# Patient Record
Sex: Female | Born: 1970 | Hispanic: No | Marital: Married | Smoking: Never smoker
Health system: Southern US, Community
[De-identification: ages and names within clinical notes are randomized; demographics above are authoritative.]

## PROBLEM LIST (undated history)

## (undated) ENCOUNTER — Inpatient Hospital Stay (HOSPITAL_COMMUNITY): Payer: Self-pay

## (undated) DIAGNOSIS — R12 Heartburn: Secondary | ICD-10-CM

## (undated) DIAGNOSIS — T7840XA Allergy, unspecified, initial encounter: Secondary | ICD-10-CM

## (undated) DIAGNOSIS — O26899 Other specified pregnancy related conditions, unspecified trimester: Secondary | ICD-10-CM

## (undated) DIAGNOSIS — O24419 Gestational diabetes mellitus in pregnancy, unspecified control: Secondary | ICD-10-CM

## (undated) HISTORY — DX: Allergy, unspecified, initial encounter: T78.40XA

---

## 2007-08-17 ENCOUNTER — Ambulatory Visit (HOSPITAL_COMMUNITY): Admission: RE | Admit: 2007-08-17 | Discharge: 2007-08-17 | Payer: Self-pay | Admitting: Family Medicine

## 2007-11-09 ENCOUNTER — Encounter: Admission: RE | Admit: 2007-11-09 | Discharge: 2007-11-09 | Payer: Self-pay | Admitting: Obstetrics & Gynecology

## 2007-11-09 ENCOUNTER — Ambulatory Visit: Payer: Self-pay | Admitting: Obstetrics & Gynecology

## 2007-11-16 ENCOUNTER — Ambulatory Visit: Payer: Self-pay | Admitting: Obstetrics & Gynecology

## 2007-11-23 ENCOUNTER — Ambulatory Visit (HOSPITAL_COMMUNITY): Admission: RE | Admit: 2007-11-23 | Discharge: 2007-11-23 | Payer: Self-pay | Admitting: Obstetrics & Gynecology

## 2007-11-23 ENCOUNTER — Ambulatory Visit: Payer: Self-pay | Admitting: Obstetrics & Gynecology

## 2007-12-03 ENCOUNTER — Ambulatory Visit: Payer: Self-pay | Admitting: Obstetrics & Gynecology

## 2007-12-14 ENCOUNTER — Ambulatory Visit: Payer: Self-pay | Admitting: Obstetrics & Gynecology

## 2008-01-04 ENCOUNTER — Ambulatory Visit: Payer: Self-pay | Admitting: Obstetrics & Gynecology

## 2008-01-07 ENCOUNTER — Ambulatory Visit: Payer: Self-pay | Admitting: Gynecology

## 2008-01-07 ENCOUNTER — Inpatient Hospital Stay (HOSPITAL_COMMUNITY): Admission: AD | Admit: 2008-01-07 | Discharge: 2008-01-10 | Payer: Self-pay | Admitting: Gynecology

## 2008-03-28 ENCOUNTER — Encounter: Admission: RE | Admit: 2008-03-28 | Discharge: 2008-03-28 | Payer: Self-pay | Admitting: Obstetrics & Gynecology

## 2009-05-02 ENCOUNTER — Emergency Department (HOSPITAL_COMMUNITY): Admission: EM | Admit: 2009-05-02 | Discharge: 2009-05-02 | Payer: Self-pay | Admitting: Emergency Medicine

## 2010-10-21 ENCOUNTER — Encounter: Payer: Self-pay | Admitting: *Deleted

## 2011-02-12 NOTE — Op Note (Signed)
Patty Blair, Patty NO.:  [id number]   MEDICAL RECORD NO.:  [id number]          PATIENT TYPE:  INP   LOCATION:                                FACILITY:  [facility]   PHYSICIAN:  Ginger Carne, MD  DATE OF BIRTH:  1971-03-27   DATE OF PROCEDURE:  01/07/2008  DATE OF DISCHARGE:                               OPERATIVE REPORT   PREOPERATIVE DIAGNOSIS:  Category II fetal heart rate tracing,  nonreassuring fetal heart rate, fetal tachycardia.   POSTOPERATIVE DIAGNOSIS:  Category II fetal heart rate tracing,  nonreassuring fetal heart rate, fetal tachycardia, term viable delivery  of female infant.   PROCEDURE:  Primary low transverse cesarean section.   SURGEON:  Ginger Carne, M.D.   ASSISTANT:  None.   COMPLICATIONS:  None immediate.   ESTIMATED BLOOD LOSS:  600 mL.   ANESTHESIA:  Epidural top off.   SPECIMEN:  Cord bloods.   OPERATIVE FINDINGS:  Term infant female delivered in vertex  presentation.  Apgar and weight per delivery room record. No gross  abnormalities.  Baby cried spontaneously at delivery.  Amniotic fluid  was meconium stained, not foul smelling.  Uterus, tubes and ovaries  showed normal decidual changes of pregnancy. Placenta was complete,  maternal and fetal surfaces normal. 3 vessel cord with central  insertion.   OPERATIVE PROCEDURE:  The patient was prepped and draped in the usual  fashion and placed in the left lateral supine position.  Betadine  solution was used for antiseptic and the patient had been catheterized  prior to the procedure.  After adequate epidural analgesia, a  Pfannenstiel incision was made and the abdomen opened.  The lower  uterine segment was incised transversely after developing the bladder  flap.  The baby delivered, cord clamped and cut, and the infant given to  the pediatric staff after bulb suctioning.  The placenta was removed  manually.  The uterus was inspected.  Closure of the uterine musculature  in one layer of 0 Vicryl running interlocking suture.  Bleeding points  were hemostatically checked.  Blood clots were removed.  Closure of the  fascia in one layer with  double loop 0 PDS running suture and skin staples for the skin.  The  instrument, needle, and sponge counts were correct.  The patient  tolerated the procedure well and returned to the post anesthesia  recovery room in excellent condition.      Ginger Carne, MD  Electronically Signed     SHB/MEDQ  D:  01/07/2008  T:  01/07/2008  Job:  [id number]

## 2011-02-12 NOTE — Discharge Summary (Signed)
NAMEMARGARIE, Patty Blair                ACCOUNT NO.:  [id number]   MEDICAL RECORD NO.:  [id number]          PATIENT TYPE:  INP   LOCATION:                                FACILITY:  [facility]   PHYSICIAN:  Tanya S. Shawnie Pons, M.D.   DATE OF BIRTH:  1971-01-11   DATE OF ADMISSION:  01/07/2008  DATE OF DISCHARGE:  01/10/2008                               DISCHARGE SUMMARY   FINAL DIAGNOSES:  Primary low transverse C-section for nonreassuring  fetal heart tones, viable female.  No complications.   SUMMARY OF LAB DATA:  At admission, the patient had a CBC that showed a  white blood cell count of 8.0, hemoglobin 14.0, hematocrit 39.9, and  platelets 242.  RPR nonreactive.  Repeat CBC showed a white blood cell  count of 7.0, hemoglobin 11.1, hematocrit 31.5, and platelets 198.   BRIEF SUMMARY OF HOSPITAL COURSE:  The patient is a 40 year old G3, P3-0-  0-3 who was admitted for onset of labor and due to nonreassuring fetal  heart tones and thick meconium.  She had a primary low-transverse C-  section performed on January 07, 2008.  EBL of 600 mL.  No complications.  This patient is blood type A, Rh positive, GBS negative, GC and  Chlamydia negative.  She had an abnormal 3-hour GTT, but had refused  treatment with glyburide and had been treated only with diet.  Rubella  nonimmune.  RPR nonreactive.  HIV negative.  The patient and infant  tolerated the procedure well.  Infant is breastfeeding without  complications.  Plan for contraception, Depo-Provera 1 dose IM before  discharge and then plan for IUD placement at followup at 6 weeks.  The  patient will follow up at [facility] in 6  weeks.  She will have her staples removed the day of discharge.  She  will need a 2-hour glucola test at her 6 weeks' followup.  Infant's  Apgars were 9 and 9, weight 7 pounds 15 ounces, length 20.5 cm.  The  patient will receive MMR vaccine before discharge.     ______________________________  Obstetrics Resident      Shelbie Proctor. Shawnie Pons, M.D.  Electronically Signed    OR/MEDQ  D:  01/10/2008  T:  01/10/2008  Job:  [id number]

## 2011-06-21 LAB — POCT URINALYSIS DIP (DEVICE)
Glucose, UA: NEGATIVE
Glucose, UA: NEGATIVE
Ketones, ur: NEGATIVE
Ketones, ur: NEGATIVE
Nitrite: NEGATIVE
Nitrite: NEGATIVE
Specific Gravity, Urine: 1.025
Specific Gravity, Urine: 1.025
Urobilinogen, UA: 1
pH: 6
pH: 6

## 2011-06-24 LAB — POCT URINALYSIS DIP (DEVICE)
Glucose, UA: NEGATIVE
Hgb urine dipstick: NEGATIVE
Ketones, ur: NEGATIVE
Protein, ur: 30 — AB
Specific Gravity, Urine: 1.015
pH: 7

## 2011-06-25 LAB — CBC
Hemoglobin: 11.1 — ABNORMAL LOW
Hemoglobin: 14
MCHC: 35
MCHC: 35.4
MCV: 92.3
MCV: 92.9
Platelets: 198
Platelets: 242
RDW: 13.8

## 2011-06-25 LAB — POCT URINALYSIS DIP (DEVICE)
Glucose, UA: NEGATIVE
Nitrite: NEGATIVE
Operator id: 194561
Protein, ur: NEGATIVE
Specific Gravity, Urine: 1.01
Urobilinogen, UA: 0.2
pH: 6.5

## 2011-06-25 LAB — RPR: RPR Ser Ql: NONREACTIVE

## 2012-01-01 ENCOUNTER — Inpatient Hospital Stay (HOSPITAL_COMMUNITY)
Admission: AD | Admit: 2012-01-01 | Discharge: 2012-01-01 | Disposition: A | Discharge status: Home / Self Care | Payer: Medicaid Other | Source: Ambulatory Visit | Attending: Obstetrics & Gynecology | Admitting: Obstetrics & Gynecology

## 2012-01-01 ENCOUNTER — Inpatient Hospital Stay (HOSPITAL_COMMUNITY): Payer: Medicaid Other

## 2012-01-01 ENCOUNTER — Encounter (HOSPITAL_COMMUNITY): Payer: Self-pay | Admitting: *Deleted

## 2012-01-01 DIAGNOSIS — O209 Hemorrhage in early pregnancy, unspecified: Secondary | ICD-10-CM

## 2012-01-01 DIAGNOSIS — N390 Urinary tract infection, site not specified: Secondary | ICD-10-CM

## 2012-01-01 DIAGNOSIS — O2390 Unspecified genitourinary tract infection in pregnancy, unspecified trimester: Secondary | ICD-10-CM

## 2012-01-01 DIAGNOSIS — O2340 Unspecified infection of urinary tract in pregnancy, unspecified trimester: Secondary | ICD-10-CM

## 2012-01-01 LAB — URINALYSIS, ROUTINE W REFLEX MICROSCOPIC
Bilirubin Urine: NEGATIVE
Glucose, UA: NEGATIVE mg/dL
Ketones, ur: NEGATIVE mg/dL
Protein, ur: 100 mg/dL — AB
Specific Gravity, Urine: 1.025 (ref 1.005–1.030)
Urobilinogen, UA: 0.2 mg/dL (ref 0.0–1.0)
pH: 5.5 (ref 5.0–8.0)

## 2012-01-01 LAB — URINE MICROSCOPIC-ADD ON

## 2012-01-01 LAB — CBC
MCH: 29.7 pg (ref 26.0–34.0)
MCHC: 33.9 g/dL (ref 30.0–36.0)
RDW: 13.2 % (ref 11.5–15.5)

## 2012-01-01 LAB — WET PREP, GENITAL: Yeast Wet Prep HPF POC: NONE SEEN

## 2012-01-01 LAB — HCG, QUANTITATIVE, PREGNANCY: hCG, Beta Chain, Quant, S: 8151 m[IU]/mL — ABNORMAL HIGH (ref <5)

## 2012-01-01 LAB — ABO/RH: ABO/RH(D): A POS

## 2012-01-01 MED ORDER — CEPHALEXIN 500 MG PO CAPS: 500.0000 mg | ORAL_CAPSULE | Freq: Two times a day (BID) | ORAL | Status: AC

## 2012-01-01 NOTE — MAU Provider Note (Signed)
History     CSN: [id number]  Arrival date and time: 01/01/12 4098   None     Chief Complaint  Patient presents with  . Vaginal Bleeding   HPI 41 y.o. G5P3003 at [redacted] weeks EGA with vaginal bleeding starting tonight and some mild low abd pain starting since arrival to MAU. Pregnancy history significant for 1 c/s and GDM.    Past Medical History  Diagnosis Date  . No pertinent past medical history     Past Surgical History  Procedure Date  . Cesarean section     No family history on file.  History  Substance Use Topics  . Smoking status: Never Smoker   . Smokeless tobacco: Not on file  . Alcohol Use: No    Allergies: No Known Allergies  No prescriptions prior to admission    Review of Systems  Constitutional: Negative.   Respiratory: Negative.   Cardiovascular: Negative.   Gastrointestinal: Positive for abdominal pain. Negative for nausea, vomiting, diarrhea and constipation.  Genitourinary: Negative for dysuria, urgency, frequency, hematuria and flank pain.       Positive for vaginal bleeding  Musculoskeletal: Negative.   Neurological: Negative.   Psychiatric/Behavioral: Negative.    Physical Exam   Blood pressure 97/58, pulse 84, temperature 98.6 F (37 C), temperature source Oral, resp. rate 18, height 5\' 4"  (1.626 m), weight 166 lb (75.297 kg), last menstrual period 11/10/2011, SpO2 100.00%.  Physical Exam  Nursing note and vitals reviewed. Constitutional: She is oriented to person, place, and time. She appears well-developed and well-nourished. No distress.  HENT:  Head: Normocephalic and atraumatic.  Cardiovascular: Normal rate, regular rhythm and normal heart sounds.   Respiratory: Effort normal and breath sounds normal. No respiratory distress.  GI: Soft. Bowel sounds are normal. She exhibits no distension and no mass. There is no tenderness. There is no rebound and no guarding.  Genitourinary: There is no rash or lesion on the right labia. There is  no rash or lesion on the left labia. Uterus is not deviated, not enlarged, not fixed and not tender. Cervix exhibits no motion tenderness, no discharge and no friability. Right adnexum displays no mass, no tenderness and no fullness. Left adnexum displays no mass, no tenderness and no fullness. There is bleeding (small) around the vagina. No erythema or tenderness around the vagina. No vaginal discharge found.  Neurological: She is alert and oriented to person, place, and time.  Skin: Skin is warm and dry.  Psychiatric: She has a normal mood and affect.    MAU Course  Procedures  Results for orders placed during the hospital encounter of 01/01/12 (from the past 24 hour(s))  URINALYSIS, ROUTINE W REFLEX MICROSCOPIC     Status: Abnormal   Collection Time   01/01/12  9:20 PM      Component Value Range   Color, Urine YELLOW  YELLOW    APPearance HAZY (*) CLEAR    Specific Gravity, Urine 1.025  1.005 - 1.030    pH 5.5  5.0 - 8.0    Glucose, UA NEGATIVE  NEGATIVE (mg/dL)   Hgb urine dipstick LARGE (*) NEGATIVE    Bilirubin Urine NEGATIVE  NEGATIVE    Ketones, ur NEGATIVE  NEGATIVE (mg/dL)   Protein, ur 119 (*) NEGATIVE (mg/dL)   Urobilinogen, UA 0.2  0.0 - 1.0 (mg/dL)   Nitrite POSITIVE (*) NEGATIVE    Leukocytes, UA NEGATIVE  NEGATIVE   URINE MICROSCOPIC-ADD ON     Status: Abnormal   Collection  Time   01/01/12  9:20 PM      Component Value Range   Squamous Epithelial / LPF MANY (*) RARE    WBC, UA 3-6  <3 (WBC/hpf)   RBC / HPF TOO NUMEROUS TO COUNT  <3 (RBC/hpf)   Bacteria, UA MANY (*) RARE    Urine-Other MUCOUS PRESENT    POCT PREGNANCY, URINE     Status: Abnormal   Collection Time   01/01/12  9:25 PM      Component Value Range   Preg Test, Ur POSITIVE (*) NEGATIVE   WET PREP, GENITAL     Status: Abnormal   Collection Time   01/01/12  9:45 PM      Component Value Range   Yeast Wet Prep HPF POC NONE SEEN  NONE SEEN    Trich, Wet Prep NONE SEEN  NONE SEEN    Clue Cells Wet Prep HPF  POC NONE SEEN  NONE SEEN    WBC, Wet Prep HPF POC FEW (*) NONE SEEN   CBC     Status: Normal   Collection Time   01/01/12  9:45 PM      Component Value Range   WBC 6.7  4.0 - 10.5 (K/uL)   RBC 4.18  3.87 - 5.11 (MIL/uL)   Hemoglobin 12.4  12.0 - 15.0 (g/dL)   HCT 16.1  09.6 - 04.5 (%)   MCV 87.6  78.0 - 100.0 (fL)   MCH 29.7  26.0 - 34.0 (pg)   MCHC 33.9  30.0 - 36.0 (g/dL)   RDW 40.9  81.1 - 91.4 (%)   Platelets 363  150 - 400 (K/uL)  ABO/RH     Status: Normal   Collection Time   01/01/12  9:45 PM      Component Value Range   ABO/RH(D) A POS    HCG, QUANTITATIVE, PREGNANCY     Status: Abnormal   Collection Time   01/01/12  9:45 PM      Component Value Range   hCG, Beta Chain, Quant, S 8151 (*) <5 (mIU/mL)   US Ob Comp Less 14 Wks  01/01/2012  *RADIOLOGY REPORT*  Clinical Data: Bleeding.  Beta HCG levels are pending.  Estimated gestational age by LMP is 7 weeks.  OBSTETRIC <14 WK [place] AND TRANSVAGINAL OB US  Technique:  Both transabdominal and transvaginal ultrasound examinations were performed for complete evaluation of the gestation as well as the maternal uterus, adnexal regions, and pelvic cul-de-sac.  Transvaginal technique was performed to assess early pregnancy.  Comparison:  None.  Intrauterine gestational sac:  A single intrauterine gestational sac is visualized.  No subchorionic hemorrhage. Yolk sac: Present Embryo: Present Cardiac Activity: Visualized Heart Rate: 122 bpm  CRL: 9.6   mm  7   w  1   d         [place] EDC: 08/18/2012  Maternal uterus/adnexae: No focal myometrial mass lesions.  No free pelvic fluid collections.  The right ovary demonstrates a simple para ovarian cyst which measures about 1.1 x 0.8 x 0.8 cm.  The left ovary demonstrates a simple cyst measuring 3.2 x 3 x 3 cm, likely representing corpus luteum cyst.  Flow is demonstrated within the left ovary but not within the cyst on color flow Doppler images.  IMPRESSION: Single intrauterine pregnancy.  Estimated gestational age  by crown- rump length is 7 weeks 1 day.  No complications visualized.  Original Report Authenticated By: Marlon Pel, M.D.   US Ob Transvaginal  01/01/2012  *RADIOLOGY REPORT*  Clinical Data: Bleeding.  Beta HCG levels are pending.  Estimated gestational age by LMP is 7 weeks.  OBSTETRIC <14 WK [place] AND TRANSVAGINAL OB US  Technique:  Both transabdominal and transvaginal ultrasound examinations were performed for complete evaluation of the gestation as well as the maternal uterus, adnexal regions, and pelvic cul-de-sac.  Transvaginal technique was performed to assess early pregnancy.  Comparison:  None.  Intrauterine gestational sac:  A single intrauterine gestational sac is visualized.  No subchorionic hemorrhage. Yolk sac: Present Embryo: Present Cardiac Activity: Visualized Heart Rate: 122 bpm  CRL: 9.6   mm  7   w  1   d         [place] EDC: 08/18/2012  Maternal uterus/adnexae: No focal myometrial mass lesions.  No free pelvic fluid collections.  The right ovary demonstrates a simple para ovarian cyst which measures about 1.1 x 0.8 x 0.8 cm.  The left ovary demonstrates a simple cyst measuring 3.2 x 3 x 3 cm, likely representing corpus luteum cyst.  Flow is demonstrated within the left ovary but not within the cyst on color flow Doppler images.  IMPRESSION: Single intrauterine pregnancy.  Estimated gestational age by crown- rump length is 7 weeks 1 day.  No complications visualized.  Original Report Authenticated By: Marlon Pel, M.D.    Assessment and Plan  41 y.o. G5P3003 at 7.[redacted] weeks EGA Vaginal bleeding - precautions rev'd UTI - rx keflex, urine culture sent Start prenatal care as soon as possible  Kyriaki Moder 01/02/2012, 2:01 AM

## 2012-01-02 LAB — GC/CHLAMYDIA PROBE AMP, URINE
Chlamydia, Swab/Urine, PCR: NEGATIVE
GC Probe Amp, Urine: NEGATIVE

## 2012-01-03 LAB — URINE CULTURE: Colony Count: >=100000

## 2012-01-04 ENCOUNTER — Inpatient Hospital Stay (HOSPITAL_COMMUNITY)
Admission: AD | Admit: 2012-01-04 | Discharge: 2012-01-05 | Disposition: A | Discharge status: Home / Self Care | Payer: Medicaid Other | Source: Ambulatory Visit | Attending: Obstetrics & Gynecology | Admitting: Obstetrics & Gynecology

## 2012-01-04 ENCOUNTER — Encounter (HOSPITAL_COMMUNITY): Payer: Self-pay | Admitting: *Deleted

## 2012-01-04 ENCOUNTER — Inpatient Hospital Stay (HOSPITAL_COMMUNITY): Payer: Medicaid Other

## 2012-01-04 DIAGNOSIS — R109 Unspecified abdominal pain: Secondary | ICD-10-CM | POA: Insufficient documentation

## 2012-01-04 DIAGNOSIS — O039 Complete or unspecified spontaneous abortion without complication: Secondary | ICD-10-CM

## 2012-01-04 NOTE — MAU Provider Note (Signed)
Chief Complaint:  Abdominal Pain and Vaginal Bleeding    First Provider Initiated Contact with Patient 01/04/12 2051      Patty Blair is  41 y.o. [phone].  Patient's last menstrual period was 11/10/2011.Marland Kitchen [redacted]w[redacted]d by early [place] on 01/01/12.  She presents complaining of Abdominal Pain and Vaginal Bleeding  Reports bleeding has been ongoing since her last MAU visit on 01/01/12 and the cramping started yesterday and has been increasing. Reports changing pad q 4 hours.  Obstetrical/Gynecological History: OB History    Grav Para Term Preterm Abortions TAB SAB Ect Mult Living   4 3 3  0 0 0 0 0 0 3      Past Medical History: Past Medical History  Diagnosis Date  . No pertinent past medical history     Past Surgical History: Past Surgical History  Procedure Date  . Cesarean section     Family History: Family History  Problem Relation Age of Onset  . Anesthesia problems Neg Hx   . Hypotension Neg Hx   . Malignant hyperthermia Neg Hx   . Pseudochol deficiency Neg Hx     Social History: History  Substance Use Topics  . Smoking status: Never Smoker   . Smokeless tobacco: Not on file  . Alcohol Use: No    Allergies: No Known Allergies  Prescriptions prior to admission  Medication Sig Dispense Refill  . cephALEXin (KEFLEX) 500 MG capsule Take 1 capsule (500 mg total) by mouth 2 (two) times daily.  14 capsule  0  . Prenatal Vit-Fe Fumarate-FA (PRENATAL MULTIVITAMIN) TABS Take 1 tablet by mouth daily.        Review of Systems - Negative except what has been reviewed in the HPI  Physical Exam   Blood pressure 121/75, pulse 84, temperature 98.2 F (36.8 C), temperature source Oral, resp. rate 18, height 5' 4.75" (1.645 m), weight 165 lb 4 oz (74.957 kg), last menstrual period 11/10/2011.  General: General appearance - alert, well appearing, and in no distress, oriented to person, place, and time and overweight Mental status - alert, oriented to person, place, and time, normal  mood, behavior, speech, dress, motor activity, and thought processes, affect appropriate to mood Abdomen - soft, nontender, nondistended, no masses or organomegaly Low transverse scar noted without tenderness Focused Gynecological Exam: VULVA: normal appearing vulva with no masses, tenderness or lesions, bleeding noted on perineum, VAGINA: vaginal discharge - bloody and moderate amount, CERVIX: cervical discharge present - bloody and dark bleeding coming from os  Labs: No results found for this or any previous visit (from the past 24 hour(s)). Imaging Studies:   RADIOLOGY REPORT*  Clinical Data: Bleeding. Early pregnancy.  TRANSVAGINAL OB ULTRASOUND  Technique: Transvaginal ultrasound was performed for evaluation of  the gestation as well as the maternal uterus and adnexal regions.  Comparison: 01/01/2012  Findings: Retroverted uterus noted. The previously seen  gestational sac is no longer observed, compatible with spontaneous  abortion. The right ovary measures 2.7 x 2.3 x 1.6 cm. The left  ovary contains a 3 cm simple cyst. Two small cysts are present  along the margin of the left ovary is well.  Endometrial thickness is 10 mm.  IMPRESSION:  1. The endometrial gestational sac is no longer present,  compatible with spontaneous abortion.  2. Left ovarian cyst noted.  Original Report Authenticated By: Dellia Cloud, M.D.       US Ob Comp Less 14 Wks  01/01/2012  *RADIOLOGY REPORT*  Clinical Data: Bleeding.  Beta HCG levels are pending.  Estimated gestational age by LMP is 7 weeks.  OBSTETRIC <14 WK [place] AND TRANSVAGINAL OB US  Technique:  Both transabdominal and transvaginal ultrasound examinations were performed for complete evaluation of the gestation as well as the maternal uterus, adnexal regions, and pelvic cul-de-sac.  Transvaginal technique was performed to assess early pregnancy.  Comparison:  None.  Intrauterine gestational sac:  A single intrauterine gestational sac is  visualized.  No subchorionic hemorrhage. Yolk sac: Present Embryo: Present Cardiac Activity: Visualized Heart Rate: 122 bpm  CRL: 9.6   mm  7   w  1   d         [place] EDC: 08/18/2012  Maternal uterus/adnexae: No focal myometrial mass lesions.  No free pelvic fluid collections.  The right ovary demonstrates a simple para ovarian cyst which measures about 1.1 x 0.8 x 0.8 cm.  The left ovary demonstrates a simple cyst measuring 3.2 x 3 x 3 cm, likely representing corpus luteum cyst.  Flow is demonstrated within the left ovary but not within the cyst on color flow Doppler images.  IMPRESSION: Single intrauterine pregnancy.  Estimated gestational age by crown- rump length is 7 weeks 1 day.  No complications visualized.  Original Report Authenticated By: Marlon Pel, M.D.     Assessment: Complete AB  Plan: Discharge home FU in Gyn Clinic in 4-6 weeks, Clinic staff will call with the date and time of your appt.  Temeka Pore E. 14-Jan-2012,1:17 AM

## 2012-01-04 NOTE — MAU Note (Signed)
Pt up to BR

## 2012-01-04 NOTE — MAU Note (Signed)
[facility] CNM at bedside with MAU small u/s.

## 2012-01-04 NOTE — MAU Note (Signed)
Pt states, " I was here Wednesday for bleeding, but today about one hour ago I started having cramping in my right low abdomen. I am still bleeding and use a pad every four hours."

## 2012-01-04 NOTE — Progress Notes (Signed)
Spec exam done by S[employer]

## 2012-01-05 MED ORDER — IBUPROFEN 600 MG PO TABS
600.0000 mg | ORAL_TABLET | Freq: Once | ORAL | Status: AC
  Administered 2012-01-05: 600 mg via ORAL
  Filled 2012-01-05: qty 1

## 2012-01-05 MED ORDER — ACETAMINOPHEN 325 MG PO TABS: 650.0000 mg | ORAL_TABLET | Freq: Four times a day (QID) | ORAL | Status: DC | PRN

## 2012-01-05 NOTE — Discharge Instructions (Signed)
Miscarriage (Spontaneous Miscarriage)  A miscarriage is when you lose your baby before the twentieth week of pregnancy. Miscarriages happen in 15-20% of pregnancies. Most miscarriages happen in the first 13 weeks of the pregnancy. In medical terms, this is called a spontaneous miscarriage or early pregnancy loss. No further treatment is needed when the miscarriage is complete and all products of conception have been passed out of the body. You can begin trying for another pregnancy as soon as your caregiver says it is okay.  CAUSES    Most causes are not known.   Genetic problems like abnormal, not enough or too many chromosomes.   Infection of the cervix or uterus.   An abnormal shaped uterus, fibroid tumors or congenital abnormalities.   Hormone problems.   Medical problems.   Incompetent cervix, the tissue in the cervix is not strong enough to hold the pregnancy.   Smoking, too much alcohol use and illegal drugs.   Trauma.  SYMPTOMS    Bleeding or spotting from the vagina.   Cramping of the lower abdomen.   Passing of fluid from the vagina with or without cramps or pain.   Passing fetal tissue.  TREATMENT    Sometimes no further treatment is necessary if you pass all the tissue in the uterus.   If partial parts of the fetus or placenta remain in the body (incomplete miscarriage), tissue left behind may become infected. Usually a D and C (Dilatation and Curettage) suction or scrapping of the uterus is necessary to remove the remaining tissue in uterus. The procedure is only done when your caregiver knows that there is no chance for the pregnancy to continue. This is determined by a physical exam, a negative pregnancy test, blood tests and perhaps an ultrasound revealing a dead fetus or no fetus developing because a problem occurred at conception (when the sperm and egg unite).   Medications may be necessary, antibiotics if there is an infection or medications to contract the uterus if there is a  lot of bleeding.   If you have Rh negative blood and your partner is Rh positive, you will need a Rho-gam shot (an immune globulin vaccine). This will protect your baby from having Rh blood problems in future pregnancies.  HOME CARE INSTRUCTIONS    Your caregiver may order bed rest (up to the bathroom only). He or she may allow you to continue light activity. You may need to make arrangements for the care of children and for any other responsibilities.   Keep track of the number of pads you use each day and how soaked (saturated) they are. Record this information.   Do not use tampons. Do not douche or have sexual intercourse until approved by your caregiver.   Only take over-the-counter or prescription medicines for pain, discomfort or fever as directed by your caregiver.   Do not take aspirin because it can cause bleeding.   It is very important to keep all follow-up appointments for re-evaluations and continuing management.   Tell your caregiver if you are experiencing domestic violence.   Women who have an Rh negative blood type (i.e., A, B, AB, or O negative) need to receive a drug called Rh(D) immune globulin (RhoGam). This medicine helps protect future fetuses against problems that can occur if an Rh negative mother is carrying a baby who is Rh positive.   If you and/or your partner are having problems with guilt or grieving, talk to your caregiver or seek counseling to help   you cope with the pregnancy loss. Allow enough time to grieve before trying to get pregnant again.  SEEK IMMEDIATE MEDICAL CARE IF:    You have severe cramps or pain in your stomach, back, or belly (abdomen).   You have a fever.   You pass large clots or tissue. Save any tissue for your caregiver to inspect.   Your bleeding increases.   You become light-headed, weak or have fainting episodes.   You develop chills.  Document Released: 03/12/2001 Document Revised: 09/05/2011 Document Reviewed: 04/18/2008  [employer] Patient  Information 2012 [employer].

## 2012-01-05 NOTE — Progress Notes (Signed)
Pillow and miscarriage packet given. Message left for chaplain.

## 2012-01-05 NOTE — MAU Provider Note (Signed)
Attestation of Attending Supervision of Advanced Practitioner: Evaluation and management procedures were performed by the OB Fellow/PA/CNM/NP under my supervision and collaboration. Chart reviewed, and agree with management and plan.  Shakedra Beam, M.D. 01/15/2012 8:25 AM   

## 2012-01-29 DEATH — deceased

## 2012-02-07 ENCOUNTER — Ambulatory Visit (INDEPENDENT_AMBULATORY_CARE_PROVIDER_SITE_OTHER): Payer: Medicaid Other | Admitting: Family Medicine

## 2012-02-07 ENCOUNTER — Encounter: Payer: Self-pay | Admitting: Family Medicine

## 2012-02-07 DIAGNOSIS — O039 Complete or unspecified spontaneous abortion without complication: Secondary | ICD-10-CM

## 2012-02-07 NOTE — Progress Notes (Signed)
  Subjective:    Patient ID: Patty Blair, female    DOB: Oct 05, 1970, 41 y.o.   MRN: [id number]  HPI Patient seen for follow up from MAU for complete AB.  Had loss approximately 1 month ago.  Has had normal period last week.  Normal flow and length.  Denies cramping, abdominal pain, vaginal discharge.   Review of Systems     Objective:   Physical Exam  Constitutional: She is oriented to person, place, and time. She appears well-developed and well-nourished.  Neurological: She is alert and oriented to person, place, and time.  Skin: Skin is warm and dry.  Psychiatric: She has a normal mood and affect. Her behavior is normal. Judgment and thought content normal.       Assessment & Plan:  1.  Complete AB As she has resumed normal periods, no need for follow up.  Discussed should continue with prenatal vitamins.  Patient does desire to become pregnant.  Encouraged pt to wait another month before trying again.

## 2012-02-07 NOTE — Patient Instructions (Signed)
Miscarriage  An early pregnancy loss or spontaneous abortion (miscarriage) is a common problem. This usually happens when the pregnancy is not developing normally. It is very unlikely that you or your partner did anything to cause this, although cigarette smoking, a sexually transmitted disease, excessive alcohol use, or drug abuse can increase the risk. Other causes are:   Abnormalities of the uterus.   Hormone or medical problems.   Trauma or genetic (chromosome) problems.  Having a miscarriage does not change your chances of having a normal pregnancy in the future. Your caregiver will advise you when it is safe to try to get pregnant again.  AFTER A MISCARRIAGE   A miscarriage is inevitable when there is continual, heavy vaginal bleeding; cramping; dilation of the cervix; or passing of any pregnancy tissue. Bleeding and cramping will usually continue until all the tissue has been removed from the womb (uterus).   Often the uterus does not clean itself out completely. A medication or a D&C procedure is needed to loosen or remove the pregnancy tissue from the uterus. A D&C scrapes or suctions the tissue out.   If you are RH negative, you may need to have Rh immune globulin to avoid Rh problems.   You may be given medication to fight an infection if the miscarriage was due to an infection.  HOME CARE INSTRUCTIONS    You should rest in bed for the next 2 to 3 days.   Do not take tub baths or put anything in your vagina, including tampons or a douche.   Do not have sex until your caregiver approves.   Avoid exercise or heavy activities until directed by your caregiver.   Save any vaginal discharge that looks like tissue. Ask your caregiver if he or she wants to inspect the discharge.   If you and your partner are having problems with guilt or grieving, talk to your caregiver or get counseling to help you understand and cope with your pregnancy loss.   Allow enough time to grieve before trying to get  pregnant again.  SEEK IMMEDIATE MEDICAL CARE IF:    You have persistent heavy bleeding or a bad smelling vaginal discharge.   You have continued abdominal or pelvic pain.   You have an oral temperature above 102 F (38.9 C), not controlled by medicine.   You have severe weakness, fainting, or keep throwing up (vomiting).   You develop chills.   You are experiencing domestic violence.  MAKE SURE YOU:    Understand these instructions.   Will watch your condition.   Will get help right away if you are not doing well or get worse.  Document Released: 10/24/2004 Document Revised: 09/05/2011 Document Reviewed: 09/08/2008  Exit[employer] Patient Information 2012 [employer].

## 2012-04-22 LAB — OB RESULTS CONSOLE RUBELLA ANTIBODY, IGM: Rubella: IMMUNE

## 2012-04-22 LAB — OB RESULTS CONSOLE ABO/RH: RH Type: POSITIVE

## 2012-04-22 LAB — OB RESULTS CONSOLE RPR: RPR: NONREACTIVE

## 2012-04-22 LAB — OB RESULTS CONSOLE HIV ANTIBODY (ROUTINE TESTING): HIV: NONREACTIVE

## 2012-10-07 ENCOUNTER — Inpatient Hospital Stay (HOSPITAL_COMMUNITY)
Admission: AD | Admit: 2012-10-07 | Discharge: 2012-10-07 | Disposition: A | Discharge status: Home / Self Care | Payer: Medicaid Other | Source: Ambulatory Visit | Attending: Obstetrics & Gynecology | Admitting: Obstetrics & Gynecology

## 2012-10-07 ENCOUNTER — Encounter (HOSPITAL_COMMUNITY): Payer: Self-pay

## 2012-10-07 DIAGNOSIS — O26899 Other specified pregnancy related conditions, unspecified trimester: Secondary | ICD-10-CM

## 2012-10-07 DIAGNOSIS — O99891 Other specified diseases and conditions complicating pregnancy: Secondary | ICD-10-CM | POA: Insufficient documentation

## 2012-10-07 DIAGNOSIS — O9989 Other specified diseases and conditions complicating pregnancy, childbirth and the puerperium: Secondary | ICD-10-CM

## 2012-10-07 DIAGNOSIS — R0602 Shortness of breath: Secondary | ICD-10-CM | POA: Insufficient documentation

## 2012-10-07 HISTORY — DX: Gestational diabetes mellitus in pregnancy, unspecified control: O24.419

## 2012-10-07 LAB — URINALYSIS, ROUTINE W REFLEX MICROSCOPIC
Glucose, UA: NEGATIVE mg/dL
Leukocytes, UA: NEGATIVE
Protein, ur: NEGATIVE mg/dL
Specific Gravity, Urine: 1.01 (ref 1.005–1.030)
Urobilinogen, UA: 0.2 mg/dL (ref 0.0–1.0)

## 2012-10-07 LAB — GLUCOSE, CAPILLARY: Glucose-Capillary: 126 mg/dL — ABNORMAL HIGH (ref 70–99)

## 2012-10-07 NOTE — MAU Note (Signed)
Pt states that she's had trouble breathing since this morning. States she does not have a hx of asthma or respiratory issues

## 2012-10-07 NOTE — MAU Provider Note (Signed)
History     CSN: [id number]  Arrival date and time: 10/07/12 1853   None     Chief Complaint  Patient presents with  . Shortness of Breath   HPI Patty Blair is a 42 y.o. female @ [redacted]w[redacted]d gestation who presents to MAU with shortness of breath. The symptoms started earlier today. Patient denies pain, just feels like she can't take a deep breath. Nothing makes the symptoms better or worse. Denies any other pregnancy problems. The history was provided by the patient.  OB History    Grav Para Term Preterm Abortions TAB SAB Ect Mult Living   5 3 3  0 0 0 0 0 0 3      Past Medical History  Diagnosis Date  . No pertinent past medical history   . Gestational diabetes     Past Surgical History  Procedure Date  . Cesarean section     Family History  Problem Relation Age of Onset  . Anesthesia problems Neg Hx   . Hypotension Neg Hx   . Malignant hyperthermia Neg Hx   . Pseudochol deficiency Neg Hx     History  Substance Use Topics  . Smoking status: Never Smoker   . Smokeless tobacco: Not on file  . Alcohol Use: No    Allergies:  Allergies  Allergen Reactions  . Omnicef (Cefdinir) Swelling    Prescriptions prior to admission  Medication Sig Dispense Refill  . cholecalciferol (VITAMIN D) 1000 UNITS tablet Take 1,000 Units by mouth daily.      Marland Kitchen glyBURIDE (DIABETA) 2.5 MG tablet Take 2.5 mg by mouth daily with breakfast.      . Prenatal Vit-Fe Fumarate-FA (PRENATAL MULTIVITAMIN) TABS Take 1 tablet by mouth daily.        Review of Systems  Constitutional: Positive for malaise/fatigue. Negative for fever, chills and weight loss.  HENT: Negative for ear pain, nosebleeds, congestion, sore throat and neck pain.   Eyes: Negative for blurred vision, double vision, photophobia and pain.  Respiratory: Positive for shortness of breath. Negative for cough and wheezing.   Cardiovascular: Negative for chest pain, palpitations and leg swelling.  Gastrointestinal: Positive for  nausea and constipation. Negative for heartburn, vomiting, abdominal pain and diarrhea.  Genitourinary: Negative for dysuria, urgency and frequency.  Musculoskeletal: Positive for back pain. Negative for myalgias.  Skin: Negative for itching and rash.  Neurological: Negative for dizziness, sensory change, speech change, seizures, weakness and headaches.  Endo/Heme/Allergies: Does not bruise/bleed easily.  Psychiatric/Behavioral: Negative for depression. The patient is not nervous/anxious and does not have insomnia.    Physical Exam   Blood pressure 115/64, pulse 91, temperature 98 F (36.7 C), temperature source Oral, resp. rate 20, height 5\' 6"  (1.676 m), weight 81.647 kg (180 lb), last menstrual period 02/28/2012, SpO2 100.00%.  Physical Exam  Nursing note and vitals reviewed. Constitutional: She is oriented to person, place, and time. She appears well-developed and well-nourished. No distress.  HENT:  Head: Normocephalic and atraumatic.  Eyes: EOM are normal.  Neck: Neck supple.  Cardiovascular: Normal rate, regular rhythm and normal heart sounds.   Respiratory: Effort normal and breath sounds normal. No respiratory distress. She has no wheezes. She has no rales. She exhibits no tenderness.  GI: Soft. There is no tenderness.       Gravid, consistent with dates.  Musculoskeletal: Normal range of motion. She exhibits no edema.  Neurological: She is alert and oriented to person, place, and time.  Skin: Skin is warm  and dry.  Psychiatric: She has a normal mood and affect. Her behavior is normal. Judgment and thought content normal.   Procedures EFM: Baseline 145, reactive tracing, rare contraction  Assessment: 42 y.o.  Female @ [redacted]w[redacted]d with shortness of breath probably due to pregnancy     Plan:  Discussed with Dr. Tamela Oddi. Since lungs are clear and O2 Sat is 100% on room air will not order chest x-ray or ABG tonight.  She will evaluate the patient in the office in the  morning. I have reviewed this patient's vital signs, nurses notes, appropriate labs and discussed findings and plan of care with the patient in detail. She voices understanding.   Mersadie Kavanaugh, RN, FNP, [facility] 10/07/2012, 8:30 PM

## 2012-11-06 ENCOUNTER — Other Ambulatory Visit: Payer: Self-pay | Admitting: Obstetrics & Gynecology

## 2012-11-06 DIAGNOSIS — O24419 Gestational diabetes mellitus in pregnancy, unspecified control: Secondary | ICD-10-CM

## 2012-11-07 ENCOUNTER — Other Ambulatory Visit: Payer: Self-pay | Admitting: Obstetrics & Gynecology

## 2012-11-07 ENCOUNTER — Other Ambulatory Visit (HOSPITAL_COMMUNITY): Payer: Self-pay | Admitting: Obstetrics & Gynecology

## 2012-11-07 DIAGNOSIS — O24419 Gestational diabetes mellitus in pregnancy, unspecified control: Secondary | ICD-10-CM

## 2012-11-09 ENCOUNTER — Ambulatory Visit (HOSPITAL_COMMUNITY)
Admission: RE | Admit: 2012-11-09 | Discharge: 2012-11-09 | Disposition: A | Discharge status: Home / Self Care | Payer: Medicaid Other | Source: Ambulatory Visit | Attending: Obstetrics & Gynecology | Admitting: Obstetrics & Gynecology

## 2012-11-09 DIAGNOSIS — O24419 Gestational diabetes mellitus in pregnancy, unspecified control: Secondary | ICD-10-CM

## 2012-11-09 DIAGNOSIS — Z3689 Encounter for other specified antenatal screening: Secondary | ICD-10-CM | POA: Insufficient documentation

## 2012-11-09 DIAGNOSIS — O09299 Supervision of pregnancy with other poor reproductive or obstetric history, unspecified trimester: Secondary | ICD-10-CM | POA: Insufficient documentation

## 2012-11-09 DIAGNOSIS — O99810 Abnormal glucose complicating pregnancy: Secondary | ICD-10-CM | POA: Insufficient documentation

## 2012-11-09 DIAGNOSIS — O34219 Maternal care for unspecified type scar from previous cesarean delivery: Secondary | ICD-10-CM | POA: Insufficient documentation

## 2012-11-09 DIAGNOSIS — O09529 Supervision of elderly multigravida, unspecified trimester: Secondary | ICD-10-CM | POA: Insufficient documentation

## 2012-11-23 ENCOUNTER — Encounter (HOSPITAL_COMMUNITY): Payer: Self-pay | Admitting: Pharmacist

## 2012-11-27 ENCOUNTER — Encounter (HOSPITAL_COMMUNITY)
Admission: RE | Admit: 2012-11-27 | Discharge: 2012-11-27 | Disposition: A | Discharge status: Home / Self Care | Payer: Medicaid Other | Source: Ambulatory Visit | Attending: Obstetrics & Gynecology | Admitting: Obstetrics & Gynecology

## 2012-11-27 ENCOUNTER — Encounter (HOSPITAL_COMMUNITY): Payer: Self-pay

## 2012-11-27 HISTORY — DX: Heartburn: R12

## 2012-11-27 HISTORY — DX: Other specified pregnancy related conditions, unspecified trimester: O26.899

## 2012-11-27 LAB — BASIC METABOLIC PANEL
BUN: 8 mg/dL (ref 6–23)
CO2: 23 mEq/L (ref 19–32)
Chloride: 101 mEq/L (ref 96–112)
Creatinine, Ser: 0.39 mg/dL — ABNORMAL LOW (ref 0.50–1.10)
GFR calc Af Amer: >90 mL/min (ref >90)
Glucose, Bld: 96 mg/dL (ref 70–99)
Potassium: 3.7 mEq/L (ref 3.5–5.1)

## 2012-11-27 LAB — CBC
HCT: 39.1 % (ref 36.0–46.0)
Hemoglobin: 13.5 g/dL (ref 12.0–15.0)
MCV: 89.7 fL (ref 78.0–100.0)
RBC: 4.36 MIL/uL (ref 3.87–5.11)
RDW: 13.3 % (ref 11.5–15.5)
WBC: 6.3 10*3/uL (ref 4.0–10.5)

## 2012-11-27 NOTE — Patient Instructions (Addendum)
Your procedure is scheduled on:11/30/12  Enter through the Main Entrance at :0730 am Pick up desk phone and dial [phone] and inform us of your arrival.  Please call [phone] if you have any problems the morning of surgery.  Remember: Do not eat after midnight:SUNDAY Water ok until 5am on Monday  DO NOT wear jewelry, eye make-up, lipstick,body lotion, or dark fingernail polish. Do not shave for 48 hours prior to surgery.  If you are to be admitted after surgery, leave suitcase in car until your room has been assigned.

## 2012-11-28 DIAGNOSIS — Z98891 History of uterine scar from previous surgery: Secondary | ICD-10-CM

## 2012-11-28 DIAGNOSIS — O24919 Unspecified diabetes mellitus in pregnancy, unspecified trimester: Secondary | ICD-10-CM | POA: Diagnosis present

## 2012-11-28 NOTE — H&P (Signed)
Patty Blair is a 42 y.o. female presenting for a scheduled, repeat C/D. Maternal Medical History:  Reason for admission: For a scheduled, repeat C/D.  Prenatal Complications - Diabetes: gestational. Diabetes is managed by oral agent (monotherapy).      OB History   Grav Para Term Preterm Abortions TAB SAB Ect Mult Living   5 3 3  0 0 0 0 0 0 3     Past Medical History  Diagnosis Date  . No pertinent past medical history   . Gestational diabetes   . Heartburn in pregnancy    Past Surgical History  Procedure Laterality Date  . Cesarean section     Family History: family history is negative for Anesthesia problems, and Hypotension, and Malignant hyperthermia, and Pseudochol deficiency, . Social History:  reports that she has never smoked. She does not have any smokeless tobacco history on file. She reports that she does not drink alcohol or use illicit drugs.     Review of Systems  Constitutional: Negative for fever.  Eyes: Negative for blurred vision.  Respiratory: Negative for shortness of breath.   Gastrointestinal: Negative for vomiting.  Skin: Negative for rash.  Neurological: Negative for headaches.      Last menstrual period 02/28/2012. Maternal Exam:  Abdomen: Fetal presentation: vertex  Introitus: not evaluated.   Cervix: not evaluated.   Physical Exam  Constitutional: She appears well-developed.  HENT:  Head: Normocephalic.  Neck: Neck supple. No thyromegaly present.  Cardiovascular: Normal rate and regular rhythm.   Respiratory: Breath sounds normal.  GI: Soft. Bowel sounds are normal.  Skin: No rash noted.    Prenatal labs: ABO, Rh: --/--/A POS (02/28 1620) Antibody: NEG (02/28 1620) Rubella: Immune (07/24 0000) RPR: NON REACTIVE (02/28 1628)  HBsAg: Negative (07/24 0000)  HIV: Non-reactive (07/24 0000)  GBS:     Assessment/Plan: Multipara @ [redacted]w[redacted]d.  Pregnancy complicated by GDM on an oral agent.  Glycemic control OK; U/S suspect for  evolving macrosomia.  H/O previous C/D.  Vitamin D deficiency/AMA  Admit Repeat C/D   JACKSON-MOORE,Emaline Karnes A 11/28/2012, 8:19 PM

## 2012-11-30 ENCOUNTER — Inpatient Hospital Stay (HOSPITAL_COMMUNITY): Payer: Medicaid Other | Admitting: Anesthesiology

## 2012-11-30 ENCOUNTER — Encounter (HOSPITAL_COMMUNITY)
Admission: RE | Disposition: A | Discharge status: Home / Self Care | Payer: Self-pay | Source: Ambulatory Visit | Attending: Obstetrics & Gynecology

## 2012-11-30 ENCOUNTER — Encounter (HOSPITAL_COMMUNITY): Payer: Self-pay | Admitting: Anesthesiology

## 2012-11-30 ENCOUNTER — Encounter (HOSPITAL_COMMUNITY): Payer: Self-pay | Admitting: *Deleted

## 2012-11-30 ENCOUNTER — Inpatient Hospital Stay (HOSPITAL_COMMUNITY)
Admission: RE | Admit: 2012-11-30 | Discharge: 2012-12-03 | DRG: 766 | Disposition: A | Discharge status: Home / Self Care | Payer: Medicaid Other | Source: Ambulatory Visit | Attending: Obstetrics & Gynecology | Admitting: Obstetrics & Gynecology

## 2012-11-30 DIAGNOSIS — K668 Other specified disorders of peritoneum: Secondary | ICD-10-CM | POA: Diagnosis present

## 2012-11-30 DIAGNOSIS — O09529 Supervision of elderly multigravida, unspecified trimester: Secondary | ICD-10-CM | POA: Diagnosis present

## 2012-11-30 DIAGNOSIS — Z98891 History of uterine scar from previous surgery: Secondary | ICD-10-CM

## 2012-11-30 DIAGNOSIS — O34219 Maternal care for unspecified type scar from previous cesarean delivery: Principal | ICD-10-CM | POA: Diagnosis present

## 2012-11-30 DIAGNOSIS — O99814 Abnormal glucose complicating childbirth: Secondary | ICD-10-CM | POA: Diagnosis present

## 2012-11-30 DIAGNOSIS — O3660X0 Maternal care for excessive fetal growth, unspecified trimester, not applicable or unspecified: Secondary | ICD-10-CM | POA: Diagnosis present

## 2012-11-30 DIAGNOSIS — O24919 Unspecified diabetes mellitus in pregnancy, unspecified trimester: Secondary | ICD-10-CM | POA: Diagnosis present

## 2012-11-30 DIAGNOSIS — O34599 Maternal care for other abnormalities of gravid uterus, unspecified trimester: Secondary | ICD-10-CM | POA: Diagnosis present

## 2012-11-30 DIAGNOSIS — D252 Subserosal leiomyoma of uterus: Secondary | ICD-10-CM | POA: Diagnosis present

## 2012-11-30 DIAGNOSIS — D4959 Neoplasm of unspecified behavior of other genitourinary organ: Secondary | ICD-10-CM | POA: Diagnosis present

## 2012-11-30 SURGERY — Surgical Case
Anesthesia: Spinal | Site: Abdomen | Wound class: Clean Contaminated

## 2012-11-30 MED ORDER — SCOPOLAMINE 1 MG/3DAYS TD PT72
1.0000 | MEDICATED_PATCH | Freq: Once | TRANSDERMAL | Status: DC
Start: 2012-11-30 — End: 2012-11-30
  Administered 2012-11-30: 1.5 mg via TRANSDERMAL

## 2012-11-30 MED ORDER — PHENYLEPHRINE HCL 10 MG/ML IJ SOLN
INTRAMUSCULAR | Status: DC | PRN
  Administered 2012-11-30: 40 ug via INTRAVENOUS

## 2012-11-30 MED ORDER — OXYTOCIN 10 UNIT/ML IJ SOLN
INTRAMUSCULAR | Status: AC
  Filled 2012-11-30: qty 4

## 2012-11-30 MED ORDER — DIPHENHYDRAMINE HCL 50 MG/ML IJ SOLN: 12.5000 mg | INTRAMUSCULAR | Status: DC | PRN

## 2012-11-30 MED ORDER — NALBUPHINE HCL 10 MG/ML IJ SOLN
5.0000 mg | INTRAMUSCULAR | Status: DC | PRN
  Filled 2012-11-30: qty 1

## 2012-11-30 MED ORDER — ONDANSETRON HCL 4 MG/2ML IJ SOLN: 4.0000 mg | Freq: Three times a day (TID) | INTRAMUSCULAR | Status: DC | PRN

## 2012-11-30 MED ORDER — ONDANSETRON HCL 4 MG/2ML IJ SOLN: 4.0000 mg | INTRAMUSCULAR | Status: DC | PRN

## 2012-11-30 MED ORDER — SCOPOLAMINE 1 MG/3DAYS TD PT72
1.0000 | MEDICATED_PATCH | Freq: Once | TRANSDERMAL | Status: DC
  Filled 2012-11-30: qty 1

## 2012-11-30 MED ORDER — ONDANSETRON HCL 4 MG/2ML IJ SOLN
INTRAMUSCULAR | Status: DC | PRN
  Administered 2012-11-30: 4 mg via INTRAVENOUS

## 2012-11-30 MED ORDER — SIMETHICONE 80 MG PO CHEW
80.0000 mg | CHEWABLE_TABLET | Freq: Three times a day (TID) | ORAL | Status: DC
  Administered 2012-11-30 – 2012-12-03 (×6): 80 mg via ORAL

## 2012-11-30 MED ORDER — FENTANYL CITRATE 0.05 MG/ML IJ SOLN: 25.0000 ug | INTRAMUSCULAR | Status: DC | PRN

## 2012-11-30 MED ORDER — DIPHENHYDRAMINE HCL 25 MG PO CAPS: 25.0000 mg | ORAL_CAPSULE | ORAL | Status: DC | PRN

## 2012-11-30 MED ORDER — ZOLPIDEM TARTRATE 5 MG PO TABS: 5.0000 mg | ORAL_TABLET | Freq: Every evening | ORAL | Status: DC | PRN

## 2012-11-30 MED ORDER — DIBUCAINE 1 % RE OINT: 1.0000 "application " | TOPICAL_OINTMENT | RECTAL | Status: DC | PRN

## 2012-11-30 MED ORDER — SIMETHICONE 80 MG PO CHEW
80.0000 mg | CHEWABLE_TABLET | ORAL | Status: DC | PRN
  Administered 2012-12-01: 80 mg via ORAL

## 2012-11-30 MED ORDER — MEPERIDINE HCL 25 MG/ML IJ SOLN: 6.2500 mg | INTRAMUSCULAR | Status: DC | PRN

## 2012-11-30 MED ORDER — ONDANSETRON HCL 4 MG/2ML IJ SOLN
INTRAMUSCULAR | Status: AC
  Filled 2012-11-30: qty 2

## 2012-11-30 MED ORDER — MENTHOL 3 MG MT LOZG: 1.0000 | LOZENGE | OROMUCOSAL | Status: DC | PRN

## 2012-11-30 MED ORDER — KETOROLAC TROMETHAMINE 30 MG/ML IJ SOLN: 30.0000 mg | Freq: Four times a day (QID) | INTRAMUSCULAR | Status: AC | PRN

## 2012-11-30 MED ORDER — TETANUS-DIPHTH-ACELL PERTUSSIS 5-2.5-18.5 LF-MCG/0.5 IM SUSP: 0.5000 mL | Freq: Once | INTRAMUSCULAR | Status: DC

## 2012-11-30 MED ORDER — MORPHINE SULFATE 0.5 MG/ML IJ SOLN
INTRAMUSCULAR | Status: AC
  Filled 2012-11-30: qty 10

## 2012-11-30 MED ORDER — SCOPOLAMINE 1 MG/3DAYS TD PT72
MEDICATED_PATCH | TRANSDERMAL | Status: AC
  Administered 2012-11-30: 1.5 mg via TRANSDERMAL
  Filled 2012-11-30: qty 1

## 2012-11-30 MED ORDER — NALOXONE HCL 1 MG/ML IJ SOLN
1.0000 ug/kg/h | INTRAVENOUS | Status: DC | PRN
  Filled 2012-11-30: qty 2

## 2012-11-30 MED ORDER — OXYCODONE-ACETAMINOPHEN 5-325 MG PO TABS
1.0000 | ORAL_TABLET | ORAL | Status: DC | PRN
  Administered 2012-12-01 – 2012-12-02 (×3): 1 via ORAL
  Filled 2012-11-30 (×3): qty 1

## 2012-11-30 MED ORDER — PRENATAL MULTIVITAMIN CH
1.0000 | ORAL_TABLET | Freq: Every day | ORAL | Status: DC
  Administered 2012-12-01 – 2012-12-03 (×3): 1 via ORAL
  Filled 2012-11-30 (×3): qty 1

## 2012-11-30 MED ORDER — OXYTOCIN 10 UNIT/ML IJ SOLN
40.0000 [IU] | INTRAVENOUS | Status: DC | PRN
  Administered 2012-11-30: 40 [IU] via INTRAVENOUS

## 2012-11-30 MED ORDER — LACTATED RINGERS IV SOLN
INTRAVENOUS | Status: DC
  Administered 2012-11-30 (×3): via INTRAVENOUS
  Administered 2012-11-30: 100 mL/h via INTRAVENOUS

## 2012-11-30 MED ORDER — IBUPROFEN 600 MG PO TABS
600.0000 mg | ORAL_TABLET | Freq: Four times a day (QID) | ORAL | Status: DC
  Administered 2012-11-30 – 2012-12-03 (×11): 600 mg via ORAL
  Filled 2012-11-30: qty 1

## 2012-11-30 MED ORDER — BUPIVACAINE IN DEXTROSE 0.75-8.25 % IT SOLN
INTRATHECAL | Status: DC | PRN
  Administered 2012-11-30: 1.6 mL via INTRATHECAL

## 2012-11-30 MED ORDER — LACTATED RINGERS IV SOLN
INTRAVENOUS | Status: DC
  Administered 2012-11-30 – 2012-12-01 (×2): via INTRAVENOUS

## 2012-11-30 MED ORDER — LANOLIN HYDROUS EX OINT: 1.0000 "application " | TOPICAL_OINTMENT | CUTANEOUS | Status: DC | PRN

## 2012-11-30 MED ORDER — OXYTOCIN 40 UNITS IN LACTATED RINGERS INFUSION - SIMPLE MED: 62.5000 mL/h | INTRAVENOUS | Status: AC

## 2012-11-30 MED ORDER — DIPHENHYDRAMINE HCL 50 MG/ML IJ SOLN: 25.0000 mg | INTRAMUSCULAR | Status: DC | PRN

## 2012-11-30 MED ORDER — METOCLOPRAMIDE HCL 5 MG/ML IJ SOLN: 10.0000 mg | Freq: Three times a day (TID) | INTRAMUSCULAR | Status: DC | PRN

## 2012-11-30 MED ORDER — FENTANYL CITRATE 0.05 MG/ML IJ SOLN
INTRAMUSCULAR | Status: DC | PRN
  Administered 2012-11-30: 25 ug via INTRATHECAL

## 2012-11-30 MED ORDER — FENTANYL CITRATE 0.05 MG/ML IJ SOLN
INTRAMUSCULAR | Status: AC
  Filled 2012-11-30: qty 2

## 2012-11-30 MED ORDER — KETOROLAC TROMETHAMINE 30 MG/ML IJ SOLN
INTRAMUSCULAR | Status: AC
  Administered 2012-11-30: 30 mg via INTRAVENOUS
  Filled 2012-11-30: qty 1

## 2012-11-30 MED ORDER — MORPHINE SULFATE (PF) 0.5 MG/ML IJ SOLN
INTRAMUSCULAR | Status: DC | PRN
  Administered 2012-11-30: .5 mg via INTRATHECAL

## 2012-11-30 MED ORDER — DIPHENHYDRAMINE HCL 25 MG PO CAPS: 25.0000 mg | ORAL_CAPSULE | Freq: Four times a day (QID) | ORAL | Status: DC | PRN

## 2012-11-30 MED ORDER — SENNOSIDES-DOCUSATE SODIUM 8.6-50 MG PO TABS
2.0000 | ORAL_TABLET | Freq: Every day | ORAL | Status: DC
  Administered 2012-11-30 – 2012-12-02 (×3): 2 via ORAL

## 2012-11-30 MED ORDER — WITCH HAZEL-GLYCERIN EX PADS
1.0000 | MEDICATED_PAD | CUTANEOUS | Status: DC | PRN
Start: 2012-11-30 — End: 2012-12-03

## 2012-11-30 MED ORDER — NALOXONE HCL 0.4 MG/ML IJ SOLN
0.4000 mg | INTRAMUSCULAR | Status: DC | PRN
Start: 2012-11-30 — End: 2012-12-03

## 2012-11-30 MED ORDER — SODIUM CHLORIDE 0.9 % IJ SOLN: 3.0000 mL | INTRAMUSCULAR | Status: DC | PRN

## 2012-11-30 MED ORDER — GENTAMICIN SULFATE 40 MG/ML IJ SOLN
Freq: Once | INTRAVENOUS | Status: AC
  Administered 2012-11-30: 100 mL via INTRAVENOUS
  Filled 2012-11-30: qty 8.3

## 2012-11-30 MED ORDER — IBUPROFEN 600 MG PO TABS
600.0000 mg | ORAL_TABLET | Freq: Four times a day (QID) | ORAL | Status: DC | PRN
  Filled 2012-11-30 (×12): qty 1

## 2012-11-30 MED ORDER — ONDANSETRON HCL 4 MG PO TABS: 4.0000 mg | ORAL_TABLET | ORAL | Status: DC | PRN

## 2012-11-30 SURGICAL SUPPLY — 41 items
BENZOIN TINCTURE PRP APPL 2/3 (GAUZE/BANDAGES/DRESSINGS) ×2 IMPLANT
CANISTER WOUND CARE 500ML ATS (WOUND CARE) IMPLANT
CLOTH BEACON ORANGE TIMEOUT ST (SAFETY) ×2 IMPLANT
CONTAINER PREFILL 10% NBF 15ML (MISCELLANEOUS) IMPLANT
DRAPE LG THREE QUARTER DISP (DRAPES) ×2 IMPLANT
DRESSING TELFA 8X3 (GAUZE/BANDAGES/DRESSINGS) IMPLANT
DRSG OPSITE POSTOP 4X10 (GAUZE/BANDAGES/DRESSINGS) ×2 IMPLANT
DRSG VAC ATS LRG SENSATRAC (GAUZE/BANDAGES/DRESSINGS) IMPLANT
DRSG VAC ATS MED SENSATRAC (GAUZE/BANDAGES/DRESSINGS) IMPLANT
DRSG VAC ATS SM SENSATRAC (GAUZE/BANDAGES/DRESSINGS) IMPLANT
DURAPREP 26ML APPLICATOR (WOUND CARE) ×2 IMPLANT
ELECT REM PT RETURN 9FT ADLT (ELECTROSURGICAL) ×2
ELECTRODE REM PT RTRN 9FT ADLT (ELECTROSURGICAL) ×1 IMPLANT
EXTRACTOR VACUUM M CUP 4 TUBE (SUCTIONS) IMPLANT
GAUZE SPONGE 4X4 12PLY STRL LF (GAUZE/BANDAGES/DRESSINGS) IMPLANT
GLOVE BIO SURGEON STRL SZ 6.5 (GLOVE) ×2 IMPLANT
GOWN STRL REIN XL XLG (GOWN DISPOSABLE) ×4 IMPLANT
KIT ABG SYR 3ML LUER SLIP (SYRINGE) IMPLANT
NEEDLE HYPO 25X5/8 SAFETYGLIDE (NEEDLE) IMPLANT
NS IRRIG 1000ML POUR BTL (IV SOLUTION) ×2 IMPLANT
PACK C SECTION WH (CUSTOM PROCEDURE TRAY) ×2 IMPLANT
PAD ABD 7.5X8 STRL (GAUZE/BANDAGES/DRESSINGS) IMPLANT
PAD OB MATERNITY 4.3X12.25 (PERSONAL CARE ITEMS) ×2 IMPLANT
RTRCTR C-SECT PINK 25CM LRG (MISCELLANEOUS) IMPLANT
SLEEVE SCD COMPRESS KNEE MED (MISCELLANEOUS) ×2 IMPLANT
STAPLER VISISTAT 35W (STAPLE) IMPLANT
STRIP CLOSURE SKIN 1/2X4 (GAUZE/BANDAGES/DRESSINGS) ×2 IMPLANT
SUT MNCRL 0 VIOLET CTX 36 (SUTURE) ×2 IMPLANT
SUT MNCRL AB 3-0 PS2 27 (SUTURE) IMPLANT
SUT MONOCRYL 0 CTX 36 (SUTURE) ×2
SUT PDS AB 0 CTX 36 PDP370T (SUTURE) ×2 IMPLANT
SUT PLAIN 0 NONE (SUTURE) IMPLANT
SUT VIC AB 0 CTXB 36 (SUTURE) IMPLANT
SUT VIC AB 2-0 CT1 (SUTURE) ×2 IMPLANT
SUT VIC AB 2-0 CT1 27 (SUTURE) ×1
SUT VIC AB 2-0 CT1 TAPERPNT 27 (SUTURE) ×1 IMPLANT
SUT VIC AB 2-0 SH 27 (SUTURE)
SUT VIC AB 2-0 SH 27XBRD (SUTURE) IMPLANT
TOWEL OR 17X24 6PK STRL BLUE (TOWEL DISPOSABLE) ×6 IMPLANT
TRAY FOLEY CATH 14FR (SET/KITS/TRAYS/PACK) ×2 IMPLANT
WATER STERILE IRR 1000ML POUR (IV SOLUTION) IMPLANT

## 2012-11-30 NOTE — Anesthesia Postprocedure Evaluation (Signed)
  Anesthesia Post-op Note  Patient: Patty Blair  Procedure(s) Performed: Procedure(s): CESAREAN SECTION (N/A)  Patient Location: PACU  Anesthesia Type:Spinal  Level of Consciousness: awake, alert  and oriented  Airway and Oxygen Therapy: Patient Spontanous Breathing  Post-op Pain: none  Post-op Assessment: Post-op Vital signs reviewed, Patient's Cardiovascular Status Stable, Respiratory Function Stable, Patent Airway, No signs of Nausea or vomiting, Pain level controlled, No headache, No backache and No residual numbness  Post-op Vital Signs: Reviewed and stable  Complications: No apparent anesthesia complications

## 2012-11-30 NOTE — Op Note (Signed)
Cesarean Section Procedure Note   [name]   11/30/2012  Indications: Scheduled Proceedure/Maternal Request   Pre-operative Diagnosis: IUP @ 39+wks/ h/o previous C/D DM- suspected macrosoma    Post-operative Diagnosis: Possible leiomyomatosis   Surgeon: Roseanna Rainbow  Assistants: Coral Ceo A  Anesthesia: spinal  Procedure Details:  The patient was seen in the Holding Room. The risks, benefits, complications, treatment options, and expected outcomes were discussed with the patient. The patient concurred with the proposed plan, giving informed consent. The patient was identified as Patty Blair and the procedure verified as C-Section Delivery. A Time Out was held and the above information confirmed.  After induction of anesthesia, the patient was draped and prepped in the usual sterile manner. A transverse incision was made and carried down through the subcutaneous tissue to the fascia. The fascial incision was made and extended transversely. The fascia was separated from the underlying rectus tissue superiorly and inferiorly. The peritoneum was identified and entered. The peritoneal incision was extended longitudinally. The utero-vesical peritoneal reflection was incised transversely and the bladder flap was sharpy freed from the lower uterine segment. A low transverse uterine incision was made. Delivered from cephalic presentation was a living newborn infant.    APGAR (5 MINS): 9   A cord ph was not sent. The umbilical cord was clamped and cut cord. A sample was obtained for evaluation. The placenta was removed Intact and appeared normal.  The uterine incision was closed with running locked sutures of 1-0 Monocryl. A second imbricating layer of the same suture was placed.  Hemostasis was observed. The paracolic gutters were irrigated. There were fibrous implants on the peritoneal surfaces of the pelvis, the uterine serosa and the omentum.  These implants had the appearance  of leiomyomata.  There were subserosal myomas.  The adnexa were normal.  Peritoneal washings as well as biopsies of the omentum and peritoneum were obtained.  The parieto peritoneum was closed in a running fashion with 2-0 Vicryl.  The fascia was then reapproximated with running sutures of 0 PDS.  The skin was closed with suture.  Instrument, sponge, and needle counts were correct prior the abdominal closure and were correct at the conclusion of the case.    Findings:  See above   Estimated Blood Loss: 400 ml  Total IV Fluids: per Anesthesiology   Urine Output: per Anesthesiology  Specimens: Placenta, omental biopsy, peritoneal implant biopsy  Complications: no complications  Disposition: PACU - hemodynamically stable.  Maternal Condition: stable   Baby condition / location:  nursery-stable    Signed: Surgeon(s): Antionette Char, MD

## 2012-11-30 NOTE — Interval H&P Note (Signed)
History and Physical Interval Note:  11/30/2012 8:54 AM  Patty Blair  has presented today for surgery, with the diagnosis of IUP @ 39+wks/ h/o previous C/D DM- suspected Christene Lye [id number]  The various methods of treatment have been discussed with the patient and family. After consideration of risks, benefits and other options for treatment, the patient has consented to  Procedure(s): CESAREAN SECTION (N/A) as a surgical intervention .  The patient's history has been reviewed, patient examined, no change in status, stable for surgery.  I have reviewed the patient's chart and labs.  Questions were answered to the patient's satisfaction.     JACKSON-MOORE,LISA A

## 2012-11-30 NOTE — Anesthesia Preprocedure Evaluation (Signed)
Anesthesia Evaluation  Patient identified by MRN, date of birth, ID band Patient awake    Reviewed: Allergy & Precautions, H&P , NPO status , Patient's Chart, lab work & pertinent test results  Airway Mallampati: II TM Distance: >3 FB Neck ROM: Full    Dental no notable dental hx. (+) Teeth Intact   Pulmonary neg pulmonary ROS,  breath sounds clear to auscultation  Pulmonary exam normal       Cardiovascular negative cardio ROS  Rhythm:Regular Rate:Normal     Neuro/Psych negative neurological ROS  negative psych ROS   GI/Hepatic negative GI ROS, Neg liver ROS,   Endo/Other  negative endocrine ROSdiabetes, Well Controlled, Type 2, Oral Hypoglycemic Agents  Renal/GU negative Renal ROS  negative genitourinary   Musculoskeletal   Abdominal Normal abdominal exam  (+)   Peds  Hematology negative hematology ROS (+)   Anesthesia Other Findings   Reproductive/Obstetrics (+) Pregnancy                           Anesthesia Physical Anesthesia Plan  ASA: II  Anesthesia Plan: Spinal   Post-op Pain Management:    Induction:   Airway Management Planned:   Additional Equipment:   Intra-op Plan:   Post-operative Plan:   Informed Consent: I have reviewed the patients History and Physical, chart, labs and discussed the procedure including the risks, benefits and alternatives for the proposed anesthesia with the patient or authorized representative who has indicated his/her understanding and acceptance.   Dental Advisory Given  Plan Discussed with: Anesthesiologist, CRNA and Surgeon  Anesthesia Plan Comments:         Anesthesia Quick Evaluation

## 2012-11-30 NOTE — Anesthesia Postprocedure Evaluation (Signed)
  Anesthesia Post-op Note  Patient: Patty Blair  Procedure(s) Performed: Procedure(s): CESAREAN SECTION (N/A)  Patient Location: PACU and Mother/Baby  Anesthesia Type:Spinal  Level of Consciousness: awake, alert , oriented and patient cooperative  Airway and Oxygen Therapy: Patient Spontanous Breathing  Post-op Pain: none  Post-op Assessment: Post-op Vital signs reviewed and Patient's Cardiovascular Status Stable  Post-op Vital Signs: Reviewed and stable  Complications: No apparent anesthesia complications

## 2012-11-30 NOTE — Anesthesia Procedure Notes (Signed)
Spinal  Patient location during procedure: OR Start time: 11/30/2012 9:26 AM Staffing Anesthesiologist: FOSTER, MICHAEL A. Performed by: anesthesiologist  Preanesthetic Checklist Completed: patient identified, site marked, surgical consent, pre-op evaluation, timeout performed, IV checked, risks and benefits discussed and monitors and equipment checked Spinal Block Patient position: sitting Prep: site prepped and draped and DuraPrep Patient monitoring: heart rate, cardiac monitor, continuous pulse ox and blood pressure Approach: midline Location: L3-4 Injection technique: single-shot Needle Needle type: Sprotte  Needle gauge: 24 G Needle length: 9 cm Needle insertion depth: 5 cm Assessment Sensory level: T4 Events: paresthesia Additional Notes Patient tolerated procedure well. Difficult due to poor postioning. Attempt x 2. Adequate sensory level.

## 2012-11-30 NOTE — Transfer of Care (Signed)
Immediate Anesthesia Transfer of Care Note  Patient: Patty Blair  Procedure(s) Performed: Procedure(s): CESAREAN SECTION (N/A)  Patient Location: PACU  Anesthesia Type:Spinal  Level of Consciousness: awake, alert  and oriented  Airway & Oxygen Therapy: Patient Spontanous Breathing  Post-op Assessment: Report given to PACU RN and Post -op Vital signs reviewed and stable  Post vital signs: stable  Complications: No apparent anesthesia complications

## 2012-12-01 ENCOUNTER — Encounter (HOSPITAL_COMMUNITY): Payer: Self-pay | Admitting: Obstetrics & Gynecology

## 2012-12-01 LAB — CBC
HCT: 34.5 % — ABNORMAL LOW (ref 36.0–46.0)
MCH: 30.7 pg (ref 26.0–34.0)
MCV: 91.3 fL (ref 78.0–100.0)
RBC: 3.78 MIL/uL — ABNORMAL LOW (ref 3.87–5.11)
RDW: 13.6 % (ref 11.5–15.5)
WBC: 5.7 10*3/uL (ref 4.0–10.5)

## 2012-12-01 NOTE — Progress Notes (Signed)
Subjective: Postpartum Day 1: Cesarean Delivery Patient reports tolerating PO, + flatus and no problems voiding.    Objective: Vital signs in last 24 hours: Temp:  [97.8 F (36.6 C)-99.1 F (37.3 C)] 98.8 F (37.1 C) (03/04 0626) Pulse Rate:  [53-74] 53 (03/04 0626) Resp:  [12-20] 16 (03/04 0626) BP: (87-118)/(51-74) 92/55 mmHg (03/04 0626) SpO2:  [98 %-100 %] 100 % (03/04 0626) Weight:  [185 lb (83.915 kg)] 185 lb (83.915 kg) (03/03 1416)  Physical Exam:  General: alert and no distress Lochia: appropriate Uterine Fundus: firm Incision: healing well DVT Evaluation: No evidence of DVT seen on physical exam.   Recent Labs  12/01/12 0510  HGB 11.6*  HCT 34.5*    Assessment/Plan: Status post Cesarean section. Doing well postoperatively.  Continue current care.  HARPER,CHARLES A 12/01/2012, 8:41 AM

## 2012-12-02 MED FILL — Heparin Sodium (Porcine) Inj 5000 Unit/ML: INTRAMUSCULAR | Qty: 1 | Status: AC

## 2012-12-02 NOTE — Progress Notes (Signed)
Subjective: Postpartum Day 2: Cesarean Delivery Patient reports tolerating PO, + flatus and no problems voiding.    Objective: Vital signs in last 24 hours: Temp:  [98.6 F (37 C)-99.1 F (37.3 C)] 98.9 F (37.2 C) (03/05 0550) Pulse Rate:  [56-63] 63 (03/05 0550) Resp:  [18] 18 (03/05 0550) BP: (92-105)/(59-65) 100/63 mmHg (03/05 0550) SpO2:  [100 %] 100 % (03/04 1030)  Physical Exam:  General: alert and no distress Lochia: appropriate Uterine Fundus: firm Incision: healing well DVT Evaluation: No evidence of DVT seen on physical exam.   Recent Labs  12/01/12 0510  HGB 11.6*  HCT 34.5*    Assessment/Plan: Status post Cesarean section. Doing well postoperatively.  Continue current care.  HARPER,CHARLES A 12/02/2012, 9:50 AM

## 2012-12-02 NOTE — Lactation Note (Signed)
This note was copied from the chart of [facility]. Lactation Consultation Note  Patient Name: Boy Khristi Schiller [id number] Date: 12/02/2012 Reason for consult: Follow-up assessment   Maternal Data    Feeding Feeding Type: Breast Fed Feeding method: Breast  LATCH Score/Interventions Latch: Grasps breast easily, tongue down, lips flanged, rhythmical sucking.  Audible Swallowing: A few with stimulation  Type of Nipple: Everted at rest and after stimulation  Comfort (Breast/Nipple): Filling, red/small blisters or bruises, mild/mod discomfort  Problem noted: Mild/Moderate discomfort  Hold (Positioning): Assistance needed to correctly position infant at breast and maintain latch. Intervention(s): Breastfeeding basics reviewed;Support Pillows;Position options  LATCH Score: 7  Lactation Tools Discussed/Used     Consult Status Consult Status: PRN  Mom reports that baby has been latching well but her nipples are hurting. Observed latch- assisted mom with positioning and repositioning bottom jaw but she states it continues to hurt. Requests manual pump to pump and bottle feed EBM.Given with instructions. No questions at present. To call for assist prn.  Pamelia Hoit 12/02/2012, 2:26 PM

## 2012-12-03 MED ORDER — OXYCODONE-ACETAMINOPHEN 5-325 MG PO TABS: 1.0000 | ORAL_TABLET | ORAL | Status: DC | PRN

## 2012-12-03 MED ORDER — IBUPROFEN 600 MG PO TABS: 600.0000 mg | ORAL_TABLET | Freq: Four times a day (QID) | ORAL | Status: DC | PRN

## 2012-12-03 NOTE — Progress Notes (Signed)
Subjective: Postpartum Day 3: Cesarean Delivery Patient reports tolerating PO, + flatus, + BM and no problems voiding.    Objective: Vital signs in last 24 hours: Temp:  [97.9 F (36.6 C)-98.4 F (36.9 C)] 97.9 F (36.6 C) (03/06 0546) Pulse Rate:  [64-75] 64 (03/06 0546) Resp:  [18] 18 (03/06 0546) BP: (113-115)/(65-72) 113/72 mmHg (03/06 0546)  Physical Exam:  General: alert and no distress Lochia: appropriate Uterine Fundus: firm Incision: healing well DVT Evaluation: No evidence of DVT seen on physical exam.   Recent Labs  12/01/12 0510  HGB 11.6*  HCT 34.5*    Assessment/Plan: Status post Cesarean section. Doing well postoperatively.  Discharge home with standard precautions and return to clinic in 2 weeks.  HARPER,CHARLES A 12/03/2012, 8:31 AM

## 2012-12-04 NOTE — Discharge Summary (Signed)
Obstetric Discharge Summary Reason for Admission: cesarean section Prenatal Procedures: ultrasound Intrapartum Procedures: Repeat Ceserean Section Postpartum Procedures: none Complications-Operative and Postpartum: none Hemoglobin  Date Value Range Status  12/01/2012 11.6* 12.0 - 15.0 g/dL Final     HCT  Date Value Range Status  12/01/2012 34.5* 36.0 - 46.0 % Final    Physical Exam:  General: alert and no distress Lochia: appropriate Uterine Fundus: firm Incision: healing well DVT Evaluation: No evidence of DVT seen on physical exam.  Discharge Diagnoses: Term Pregnancy-delivered  Discharge Information: Date: 12/04/2012 Activity: pelvic rest Diet: routine Medications: PNV, Ibuprofen, Percocet Condition: stable Instructions: refer to practice specific booklet Discharge to: home Follow-up Information   Follow up with Roseanna Rainbow, MD.   Contact information:   [address] [place] [address] [phone]       Newborn Data: Live born female  Birth Weight: 8 lb 3.6 oz (3730 g) APGAR: , 9  Home with mother.  HARPER,CHARLES A 12/04/2012, 9:46 AM

## 2012-12-22 ENCOUNTER — Encounter: Payer: Self-pay | Admitting: Obstetrics & Gynecology

## 2012-12-22 DIAGNOSIS — D484 Neoplasm of uncertain behavior of peritoneum: Secondary | ICD-10-CM | POA: Insufficient documentation

## 2012-12-29 ENCOUNTER — Encounter (HOSPITAL_COMMUNITY): Payer: Self-pay | Admitting: *Deleted

## 2012-12-29 ENCOUNTER — Encounter: Payer: Self-pay | Admitting: Obstetrics & Gynecology

## 2013-01-25 ENCOUNTER — Encounter: Payer: Self-pay | Admitting: Obstetrics & Gynecology

## 2013-01-25 ENCOUNTER — Ambulatory Visit (INDEPENDENT_AMBULATORY_CARE_PROVIDER_SITE_OTHER): Payer: Medicaid Other | Admitting: Obstetrics & Gynecology

## 2013-01-25 NOTE — Progress Notes (Signed)
Subjective:     Patty Blair is a 42 y.o. female who presents for a postpartum visit. She is 8 weeks postpartum following a low cervical transverse Cesarean section. I have fully reviewed the prenatal and intrapartum course. The delivery was at 39 gestational weeks. Outcome: repeat cesarean section, low transverse incision. Anesthesia: spinal. Postpartum course has been normal. Baby's course has been normal. Baby is feeding by both breast and bottle - Enfamil Gentle. Bleeding no bleeding. Bowel function is normal. Bladder function is normal. Patient is sexually active. Contraception method is condoms. Postpartum depression screening: negative.  The following portions of the patient's history were reviewed and updated as appropriate: allergies, current medications, past family history, past medical history, past social history, past surgical history and problem list.  Review of Systems Pertinent items are noted in HPI.   Objective:    BP 117/79  Pulse 87  Temp(Src) 97.8 F (36.6 C) (Oral)  Ht 5\' 4"  (1.626 m)  Wt 157 lb (71.215 kg)  BMI 26.94 kg/m2  Breastfeeding? Yes        General:  alert     Abdomen: soft, non-tender; bowel sounds normal; no masses,  no organomegaly: incision well-healea   Vulva:  Scarring  Vagina: normal vagina  Cervix:  no lesions  Corpus: normal size, contour, position, consistency, mobility, non-tender  Adnexa:  normal adnexa   Assessment:     Nomal postpartum exam.  Plan:    1. Contraception: condoms 2. CT in 3 mths 3. Follow up after the CT scan 4.  Repeat 2hr GTT

## 2013-01-25 NOTE — Patient Instructions (Addendum)
Glucose Tolerance Test This is a test to see how your body processes carbohydrates. This test is often done to check patients for diabetes or the possibility of developing it. PREPARATION FOR TEST You should have nothing to eat or drink 12 hours before the test. You will be given a form of sugar (glucose) and then blood samples will be drawn from your vein to determine the level of sugar in your blood. Alternatively, blood may be drawn from your finger for testing. You should not smoke or exercise during the test. NORMAL FINDINGS  Fasting: 70-115 mg/dL  30 minutes: less than 200 mg/dL  1 hour: less than 200 mg/dL  2 hours: less than 140 mg/dL  3 hours: 70-115 mg/dL  4 hours: 70-115 mg/dL Ranges for normal findings may vary among different laboratories and hospitals. You should always check with your doctor after having lab work or other tests done to discuss the meaning of your test results and whether your values are considered within normal limits. MEANING OF TEST Your caregiver will go over the test results with you and discuss the importance and meaning of your results, as well as treatment options and the need for additional tests. OBTAINING THE TEST RESULTS It is your responsibility to obtain your test results. Ask the lab or department performing the test when and how you will get your results. Document Released: 10/09/2004 Document Revised: 12/09/2011 Document Reviewed: 08/27/2008 [employer] Patient Information 2013 [employer].  

## 2013-01-25 NOTE — Progress Notes (Signed)
  Subjective:    Patient ID: Patty Blair, female    DOB: 05-Jun-1971, 42 y.o.   MRN: [id number]  HPI    Review of Systems     Objective:   Physical Exam        Assessment & Plan:

## 2013-01-26 ENCOUNTER — Encounter: Payer: Self-pay | Admitting: Obstetrics & Gynecology

## 2013-01-26 ENCOUNTER — Other Ambulatory Visit: Payer: Self-pay | Admitting: Obstetrics & Gynecology

## 2013-01-26 DIAGNOSIS — D483 Neoplasm of uncertain behavior of retroperitoneum: Secondary | ICD-10-CM

## 2013-01-26 DIAGNOSIS — D259 Leiomyoma of uterus, unspecified: Secondary | ICD-10-CM

## 2013-01-26 DIAGNOSIS — C499 Malignant neoplasm of connective and soft tissue, unspecified: Secondary | ICD-10-CM

## 2013-01-27 ENCOUNTER — Other Ambulatory Visit: Payer: Medicaid Other | Admitting: *Deleted

## 2013-01-27 DIAGNOSIS — O24439 Gestational diabetes mellitus in the puerperium, unspecified control: Secondary | ICD-10-CM

## 2013-03-02 ENCOUNTER — Ambulatory Visit (HOSPITAL_COMMUNITY)
Admission: RE | Admit: 2013-03-02 | Discharge: 2013-03-02 | Disposition: A | Discharge status: Home / Self Care | Payer: BC Managed Care – PPO | Source: Ambulatory Visit | Attending: Obstetrics & Gynecology | Admitting: Obstetrics & Gynecology

## 2013-03-02 DIAGNOSIS — D259 Leiomyoma of uterus, unspecified: Secondary | ICD-10-CM | POA: Insufficient documentation

## 2013-03-02 DIAGNOSIS — N854 Malposition of uterus: Secondary | ICD-10-CM | POA: Insufficient documentation

## 2013-03-02 DIAGNOSIS — M549 Dorsalgia, unspecified: Secondary | ICD-10-CM | POA: Insufficient documentation

## 2013-03-02 DIAGNOSIS — R109 Unspecified abdominal pain: Secondary | ICD-10-CM | POA: Insufficient documentation

## 2013-03-02 MED ORDER — IOHEXOL 300 MG/ML  SOLN
100.0000 mL | Freq: Once | INTRAMUSCULAR | Status: AC | PRN
  Administered 2013-03-02: 100 mL via INTRAVENOUS

## 2013-05-15 IMAGING — US US OB TRANSVAGINAL
1 series · 13 of 28 positions shown · non-contrast
Comparison: None.

CLINICAL DATA: Bleeding.  Beta HCG levels are pending.  Estimated
gestational age by LMP is 7 weeks.

OBSTETRIC <14 WK US AND TRANSVAGINAL OB US
TECHNIQUE: Both transabdominal and transvaginal ultrasound
examinations were performed for complete evaluation of the
gestation as well as the maternal uterus, adnexal regions, and
pelvic cul-de-sac.  Transvaginal technique was performed to assess
early pregnancy.

[Series 1: us ob comp less 14 wks · 48 acquisitions, 13 frames shown]
[im 2/48]
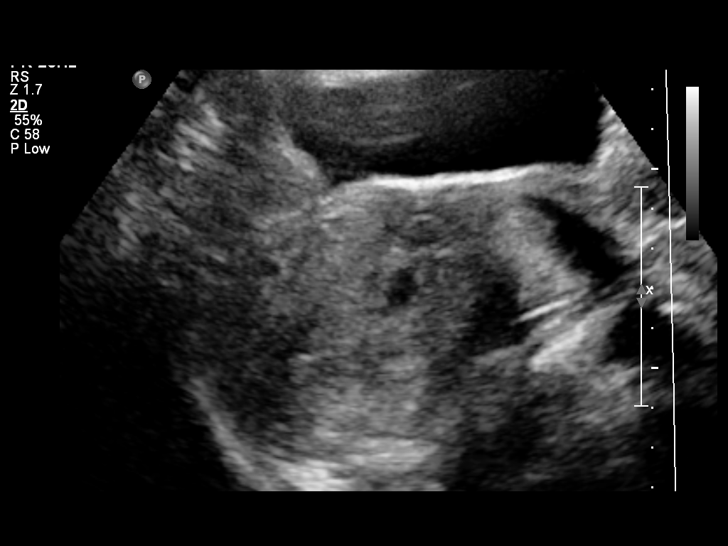
[im 6/48]
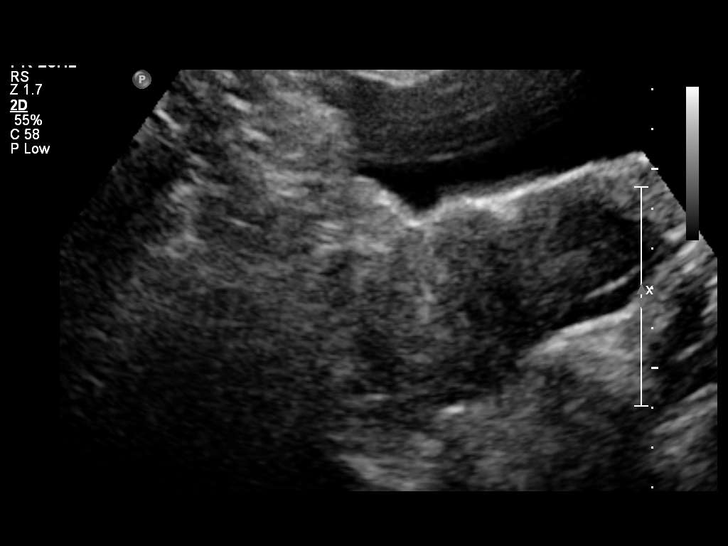
[im 9/48]
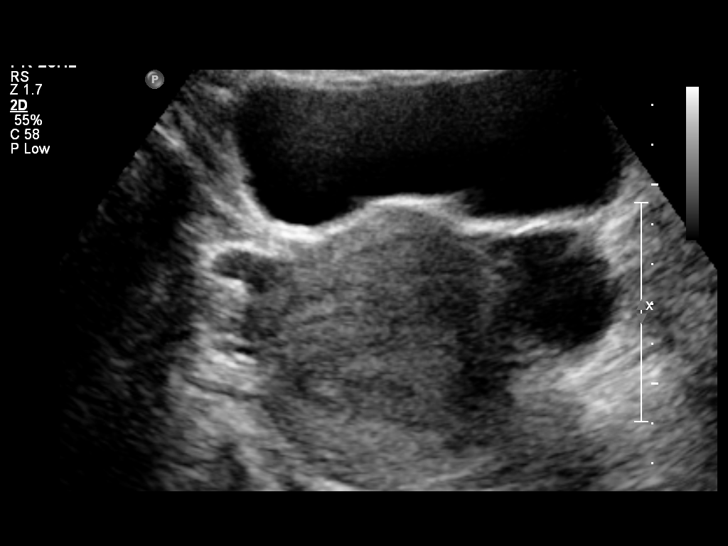
[im 13/48]
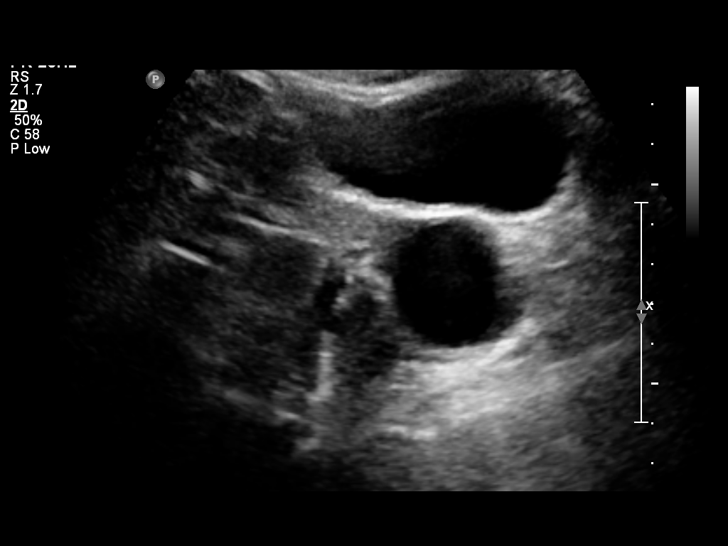
[im 16/48]
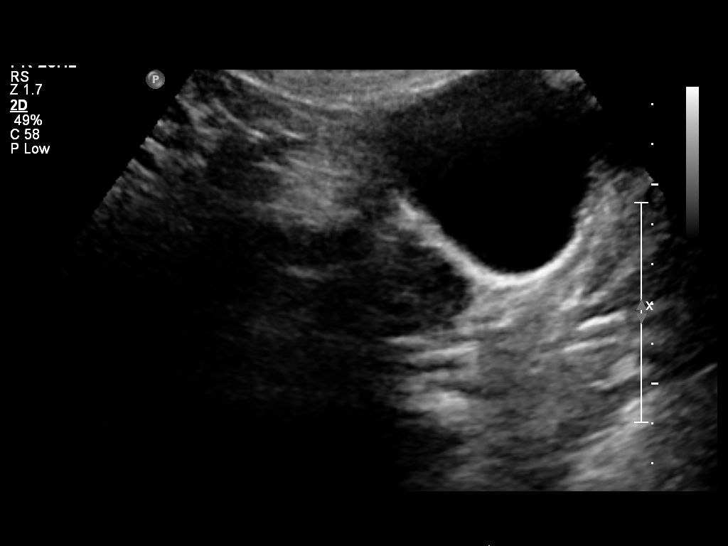
[im 20/48]
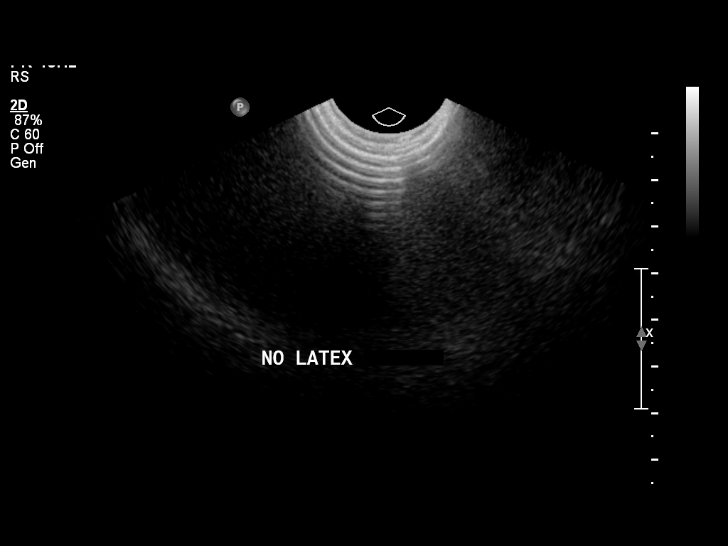
[im 25/48]
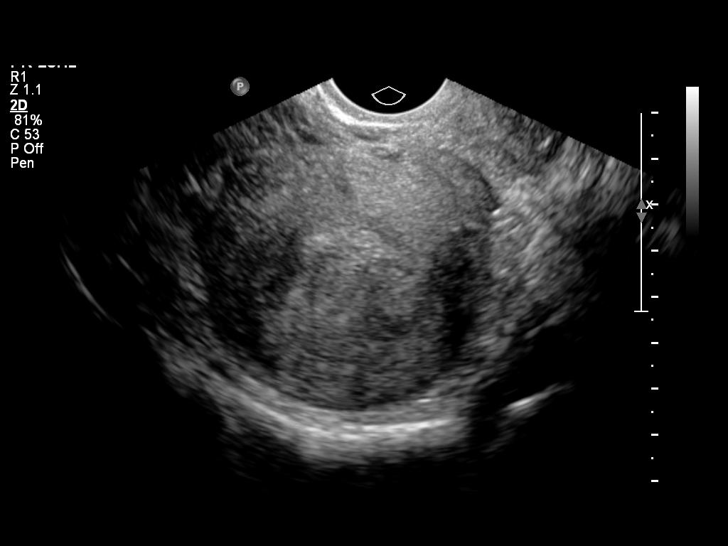
[im 28/48]
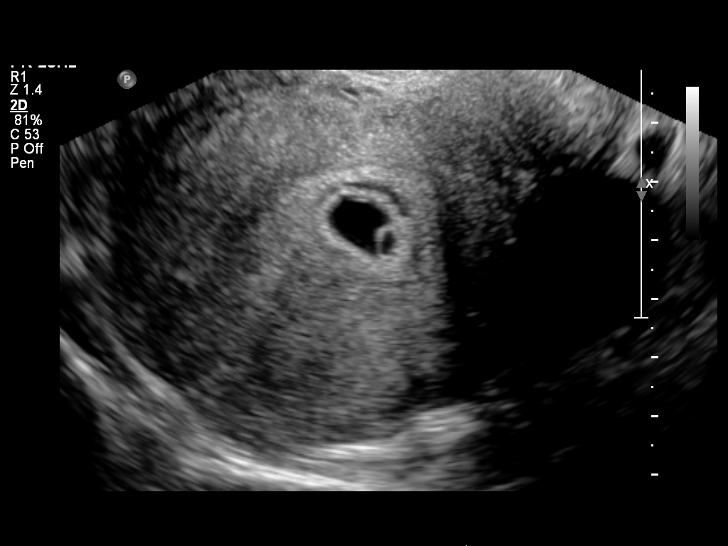
[im 32/48]
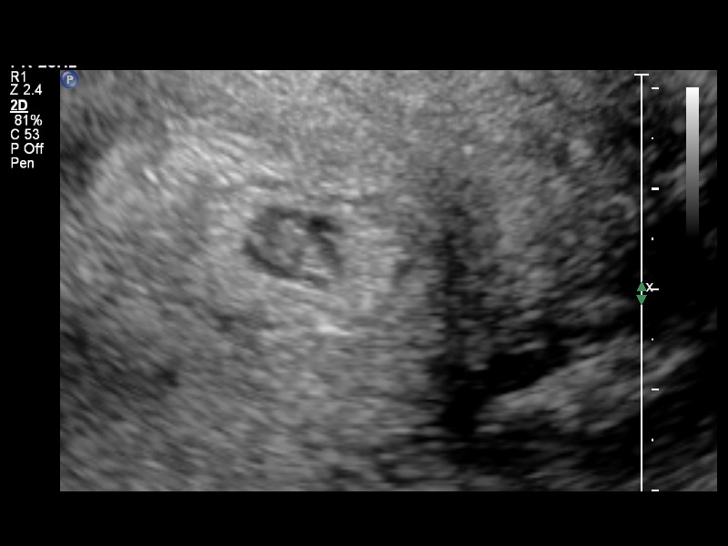
[im 35/48]
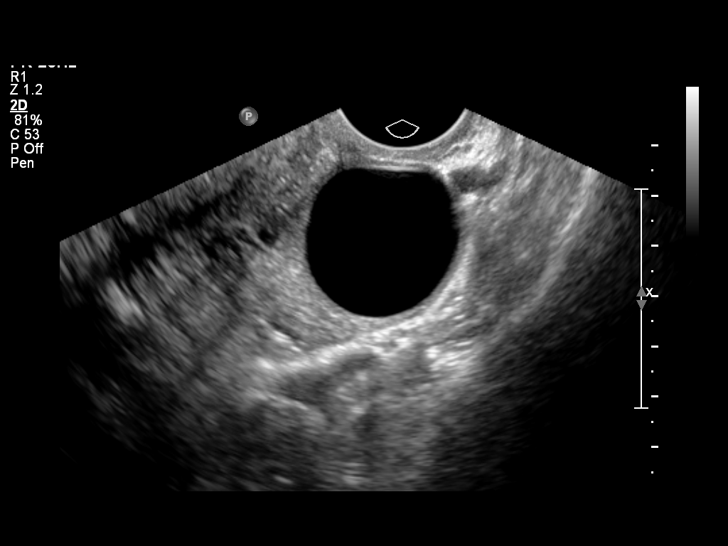
[im 39/48]
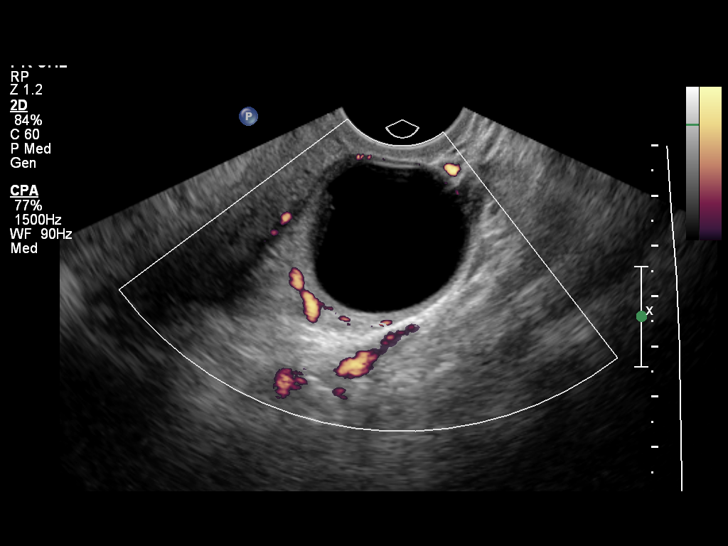
[im 42/48]
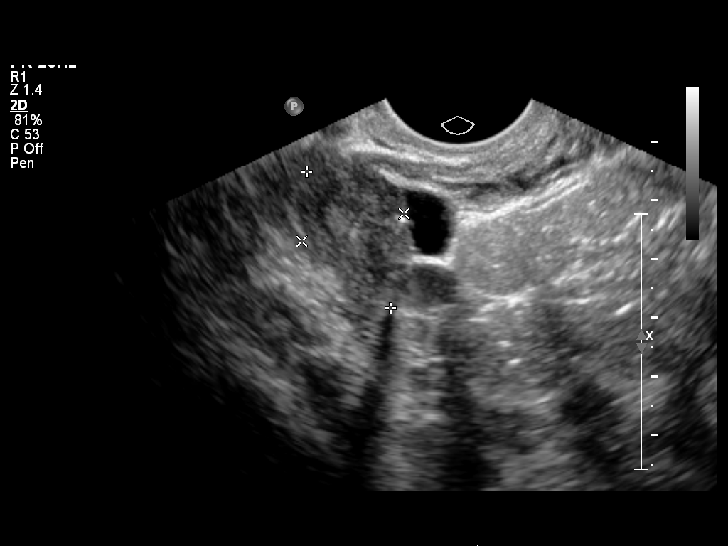
[im 46/48]
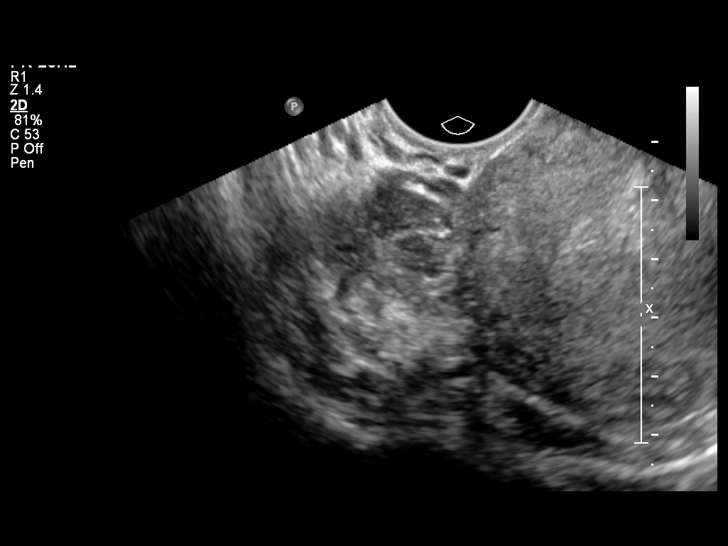

[13 of 28 positions shown; findings below may reference images not displayed]

Intrauterine gestational sac:  A single intrauterine gestational
sac is visualized.  No subchorionic hemorrhage.
Yolk sac: Present
Embryo: Present
Cardiac Activity: Visualized
Heart Rate: 122 bpm

CRL: 9.6   mm  7   w  1   d         US EDC: 08/18/2012

Maternal uterus/adnexae:
No focal myometrial mass lesions.  No free pelvic fluid
collections.  The right ovary demonstrates a simple para ovarian
cyst which measures about 1.1 x 0.8 x 0.8 cm.  The left ovary
demonstrates a simple cyst measuring 3.2 x 3 x 3 cm, likely
representing corpus luteum cyst.  Flow is demonstrated within the
left ovary but not within the cyst on color flow Doppler images.
IMPRESSION: Single intrauterine pregnancy.  Estimated gestational age by crown-
rump length is 7 weeks 1 day.  No complications visualized.

## 2013-08-30 NOTE — H&P (Signed)
Assessment  Tinnitus (388.30) (H93.19). 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 Orders  Audiological Evaluation; Tympanometry Bilateral; Comprehensive Audiometry; Comprehensive Audiometry; Requested for: 22 Jun 2013. Discussed  Bilateral conductive hearing loss, with noticeable tinnitus on the left. 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 Like to repeat audiometric evaluation including acoustic reflexes in a few weeks to see if there is consistency and to see if there is evidence of stapedial fixation. He had some discussion about hearing aids and middle ear surgery. We'll discuss this in more detail at the next visit. Reason For Visit  Patty Blair is here today at the kind request of none for consultation and opinion for tinnitus. HPI  12 year history of ringing in the left ear. It started after she gave birth to her oldest child. She hasn't noticed any change. She hasn't noticed any loss of hearing. She has occasional pain. Allergies  Omnicef. Current Meds  No Reported Medications;; RPT. Family Hx  No pertinent family history: Mother. Personal Hx  Never smoker (V49.89) (Z78.9) No alcohol use No caffeine use Non-smoker (V49.89) (Z78.9). ROS  Systemic: Not feeling tired (fatigue).  No fever, no night sweats, and no recent weight loss. Head: No headache. Eyes: No eye symptoms. Otolaryngeal: No hearing loss, no earache, no tinnitus, and no purulent nasal discharge.  No nasal passage blockage (stuffiness), no snoring, no sneezing, no hoarseness, and no sore throat. Cardiovascular: No chest pain or discomfort  and no palpitations. Pulmonary: No dyspnea, no cough, and no wheezing. Gastrointestinal: No dysphagia  and no heartburn.  No nausea, no abdominal pain, and no melena.  No diarrhea. Genitourinary: No dysuria. Endocrine: No muscle weakness. Musculoskeletal: No calf muscle cramps, no arthralgias, and no soft tissue swelling. Neurological: No dizziness, no  fainting, no tingling, and no numbness. Psychological: No anxiety  and no depression. Skin: No rash. 12 system ROS was obtained and reviewed on the Health Maintenance form dated today.  Positive responses are shown above.  If the symptom is not checked, the patient has denied it. Vital Signs   Recorded by Skolimowski,Sharon on 22 Jun 2013 11:01 AM BP:100/60,  Height: 65 in, Weight: 160 lb, BMI: 26.6 kg/m2,  BMI Calculated: 26.63 ,  BSA Calculated: 1.80. Physical Exam  APPEARANCE: Well developed, well nourished, in no acute distress.  Normal affect, in a pleasant mood.  Oriented to time, place and person. COMMUNICATION: Normal voice   HEAD & FACE:  No scars, lesions or masses of head and face.  Sinuses nontender to palpation.  Salivary glands without mass or tenderness.  Facial strength symmetric.  No facial lesion, scars, or mass. EYES: EOMI with normal primary gaze alignment. Visual acuity grossly intact.  PERRLA EXTERNAL EAR & NOSE: No scars, lesions or masses  EAC & TYMPANIC MEMBRANE:  EAC shows no obstructing lesions or debris and tympanic membranes are normal bilaterally with good movement to insufflation. GROSS HEARING: Normal   TMJ:  Nontender  INTRANASAL EXAM: No polyps or purulence.  NASOPHARYNX: Normal, without lesions. LIPS, TEETH & GUMS: No lip lesions, normal dentition and normal gums. ORAL CAVITY/OROPHARYNX:  Oral mucosa moist without lesion or asymmetry of the palate, tongue, tonsil or posterior pharynx. NECK:  Supple without adenopathy or mass. THYROID:  Normal with no masses palpable.  NEUROLOGIC:  No gross CN deficits. No nystagmus noted.   LYMPHATIC:  No enlarged nodes palpable. Results  Normal tympanograms. Bilateral mainly low-frequency conductive hearing loss. She is in the moderate range in the right ear is slightly  worse than the left. Signature  Electronically signed by : Serena Colonel  M.D.; 06/22/2013 1:18 PM EST.

## 2013-09-01 ENCOUNTER — Encounter (HOSPITAL_BASED_OUTPATIENT_CLINIC_OR_DEPARTMENT_OTHER): Payer: Self-pay | Admitting: *Deleted

## 2013-09-06 ENCOUNTER — Ambulatory Visit (HOSPITAL_BASED_OUTPATIENT_CLINIC_OR_DEPARTMENT_OTHER): Payer: Medicaid Other | Admitting: Anesthesiology

## 2013-09-06 ENCOUNTER — Encounter (HOSPITAL_BASED_OUTPATIENT_CLINIC_OR_DEPARTMENT_OTHER)
Admission: RE | Disposition: A | Discharge status: Home / Self Care | Payer: Self-pay | Source: Ambulatory Visit | Attending: Otolaryngology

## 2013-09-06 ENCOUNTER — Ambulatory Visit (HOSPITAL_BASED_OUTPATIENT_CLINIC_OR_DEPARTMENT_OTHER)
Admission: RE | Admit: 2013-09-06 | Discharge: 2013-09-06 | Disposition: A | Discharge status: Home / Self Care | Payer: Medicaid Other | Source: Ambulatory Visit | Attending: Otolaryngology | Admitting: Otolaryngology

## 2013-09-06 ENCOUNTER — Encounter (HOSPITAL_BASED_OUTPATIENT_CLINIC_OR_DEPARTMENT_OTHER): Payer: Medicaid Other | Admitting: Anesthesiology

## 2013-09-06 ENCOUNTER — Encounter (HOSPITAL_BASED_OUTPATIENT_CLINIC_OR_DEPARTMENT_OTHER): Payer: Self-pay

## 2013-09-06 DIAGNOSIS — H9319 Tinnitus, unspecified ear: Secondary | ICD-10-CM | POA: Insufficient documentation

## 2013-09-06 DIAGNOSIS — H902 Conductive hearing loss, unspecified: Secondary | ICD-10-CM | POA: Insufficient documentation

## 2013-09-06 DIAGNOSIS — E119 Type 2 diabetes mellitus without complications: Secondary | ICD-10-CM | POA: Insufficient documentation

## 2013-09-06 DIAGNOSIS — H8090 Unspecified otosclerosis, unspecified ear: Secondary | ICD-10-CM | POA: Diagnosis present

## 2013-09-06 HISTORY — PX: STAPEDECTOMY: SHX2435

## 2013-09-06 LAB — POCT HEMOGLOBIN-HEMACUE: Hemoglobin: 13.4 g/dL (ref 12.0–15.0)

## 2013-09-06 SURGERY — STAPEDECTOMY
Anesthesia: General | Site: Ear | Laterality: Left

## 2013-09-06 MED ORDER — HYDROCODONE-ACETAMINOPHEN 7.5-325 MG PO TABS: 1.0000 | ORAL_TABLET | Freq: Four times a day (QID) | ORAL | Status: DC | PRN

## 2013-09-06 MED ORDER — FENTANYL CITRATE 0.05 MG/ML IJ SOLN
INTRAMUSCULAR | Status: AC
  Filled 2013-09-06: qty 4

## 2013-09-06 MED ORDER — PROPOFOL 10 MG/ML IV BOLUS
INTRAVENOUS | Status: AC
  Filled 2013-09-06: qty 20

## 2013-09-06 MED ORDER — HYDROMORPHONE HCL PF 1 MG/ML IJ SOLN: 0.2500 mg | INTRAMUSCULAR | Status: DC | PRN

## 2013-09-06 MED ORDER — DIAZEPAM 5 MG PO TABS: 5.0000 mg | ORAL_TABLET | Freq: Four times a day (QID) | ORAL | Status: DC | PRN

## 2013-09-06 MED ORDER — EPINEPHRINE HCL 1 MG/ML IJ SOLN
INTRAMUSCULAR | Status: DC | PRN
  Administered 2013-09-06: 1 mg

## 2013-09-06 MED ORDER — LIDOCAINE HCL (CARDIAC) 20 MG/ML IV SOLN
INTRAVENOUS | Status: DC | PRN
  Administered 2013-09-06: 60 mg via INTRAVENOUS

## 2013-09-06 MED ORDER — FENTANYL CITRATE 0.05 MG/ML IJ SOLN: 50.0000 ug | INTRAMUSCULAR | Status: DC | PRN

## 2013-09-06 MED ORDER — METHYLENE BLUE 1 % INJ SOLN
INTRAMUSCULAR | Status: DC | PRN
  Administered 2013-09-06: 1 mL via SUBMUCOSAL

## 2013-09-06 MED ORDER — MIDAZOLAM HCL 5 MG/5ML IJ SOLN
INTRAMUSCULAR | Status: DC | PRN
  Administered 2013-09-06: 2 mg via INTRAVENOUS

## 2013-09-06 MED ORDER — HYDROCODONE-ACETAMINOPHEN 5-325 MG PO TABS
1.0000 | ORAL_TABLET | ORAL | Status: DC | PRN
  Administered 2013-09-06: 1 via ORAL

## 2013-09-06 MED ORDER — FENTANYL CITRATE 0.05 MG/ML IJ SOLN
INTRAMUSCULAR | Status: DC | PRN
  Administered 2013-09-06: 100 ug via INTRAVENOUS

## 2013-09-06 MED ORDER — EPHEDRINE SULFATE 50 MG/ML IJ SOLN
INTRAMUSCULAR | Status: DC | PRN
  Administered 2013-09-06: 10 mg via INTRAVENOUS

## 2013-09-06 MED ORDER — SUCCINYLCHOLINE CHLORIDE 20 MG/ML IJ SOLN
INTRAMUSCULAR | Status: DC | PRN
  Administered 2013-09-06: 100 mg via INTRAVENOUS

## 2013-09-06 MED ORDER — PROPOFOL 10 MG/ML IV BOLUS
INTRAVENOUS | Status: DC | PRN
  Administered 2013-09-06: 130 mg via INTRAVENOUS

## 2013-09-06 MED ORDER — CIPROFLOXACIN-DEXAMETHASONE 0.3-0.1 % OT SUSP: 3.0000 [drp] | Freq: Three times a day (TID) | OTIC | Status: DC

## 2013-09-06 MED ORDER — CIPROFLOXACIN-DEXAMETHASONE 0.3-0.1 % OT SUSP
OTIC | Status: AC
  Filled 2013-09-06: qty 7.5

## 2013-09-06 MED ORDER — DEXAMETHASONE SODIUM PHOSPHATE 4 MG/ML IJ SOLN
INTRAMUSCULAR | Status: DC | PRN
  Administered 2013-09-06: 10 mg via INTRAVENOUS

## 2013-09-06 MED ORDER — LACTATED RINGERS IV SOLN
INTRAVENOUS | Status: DC
  Administered 2013-09-06 (×2): via INTRAVENOUS

## 2013-09-06 MED ORDER — ONDANSETRON 4 MG PO TBDP: 4.0000 mg | ORAL_TABLET | Freq: Three times a day (TID) | ORAL | Status: DC | PRN

## 2013-09-06 MED ORDER — CIPROFLOXACIN-DEXAMETHASONE 0.3-0.1 % OT SUSP
OTIC | Status: DC | PRN
  Administered 2013-09-06: 4 [drp] via OTIC

## 2013-09-06 MED ORDER — MIDAZOLAM HCL 2 MG/2ML IJ SOLN
INTRAMUSCULAR | Status: AC
  Filled 2013-09-06: qty 2

## 2013-09-06 MED ORDER — ONDANSETRON HCL 4 MG PO TABS: 4.0000 mg | ORAL_TABLET | Freq: Three times a day (TID) | ORAL | Status: DC | PRN

## 2013-09-06 MED ORDER — HYDROCODONE-ACETAMINOPHEN 5-325 MG PO TABS
ORAL_TABLET | ORAL | Status: AC
  Filled 2013-09-06: qty 1

## 2013-09-06 MED ORDER — DEXTROSE-NACL 5-0.9 % IV SOLN
INTRAVENOUS | Status: DC
  Administered 2013-09-06: 75 mL/h via INTRAVENOUS

## 2013-09-06 MED ORDER — BACITRACIN ZINC 500 UNIT/GM EX OINT
TOPICAL_OINTMENT | CUTANEOUS | Status: DC | PRN
  Administered 2013-09-06: 1 via TOPICAL

## 2013-09-06 MED ORDER — IBUPROFEN 100 MG/5ML PO SUSP: 400.0000 mg | Freq: Four times a day (QID) | ORAL | Status: DC | PRN

## 2013-09-06 MED ORDER — MIDAZOLAM HCL 2 MG/2ML IJ SOLN: 1.0000 mg | INTRAMUSCULAR | Status: DC | PRN

## 2013-09-06 MED ORDER — PHENOL 1.4 % MT LIQD
1.0000 | OROMUCOSAL | Status: DC | PRN
  Administered 2013-09-06: 1 via OROMUCOSAL

## 2013-09-06 MED ORDER — ONDANSETRON HCL 4 MG/2ML IJ SOLN
INTRAMUSCULAR | Status: DC | PRN
  Administered 2013-09-06: 4 mg via INTRAVENOUS

## 2013-09-06 MED ORDER — LIDOCAINE-EPINEPHRINE 1 %-1:100000 IJ SOLN
INTRAMUSCULAR | Status: DC | PRN
  Administered 2013-09-06: 2.5 mL

## 2013-09-06 SURGICAL SUPPLY — 35 items
BLADE SURG ROTATE 9660 (MISCELLANEOUS) IMPLANT
CANISTER SUCT 1200ML W/VALVE (MISCELLANEOUS) ×2 IMPLANT
CLEANER CAUTERY TIP 5X5 PAD (MISCELLANEOUS) IMPLANT
COTTONBALL LRG STERILE PKG (GAUZE/BANDAGES/DRESSINGS) ×2 IMPLANT
DECANTER SPIKE VIAL GLASS SM (MISCELLANEOUS) ×2 IMPLANT
DERMABOND ADVANCED (GAUZE/BANDAGES/DRESSINGS)
DERMABOND ADVANCED .7 DNX12 (GAUZE/BANDAGES/DRESSINGS) IMPLANT
DRAPE MICROSCOPE URBAN (DRAPES) ×2 IMPLANT
DROPPER MEDICINE STER 1.5ML LF (MISCELLANEOUS) IMPLANT
ELECT COATED BLADE 2.86 ST (ELECTRODE) ×2 IMPLANT
ELECT REM PT RETURN 9FT ADLT (ELECTROSURGICAL) ×2
ELECTRODE REM PT RTRN 9FT ADLT (ELECTROSURGICAL) ×1 IMPLANT
GLOVE ECLIPSE 7.5 STRL STRAW (GLOVE) ×2 IMPLANT
GLOVE SURG SS PI 7.0 STRL IVOR (GLOVE) ×4 IMPLANT
GOWN PREVENTION PLUS XLARGE (GOWN DISPOSABLE) ×2 IMPLANT
GOWN PREVENTION PLUS XXLARGE (GOWN DISPOSABLE) ×2 IMPLANT
IV CATH AUTO 14GX1.75 SAFE ORG (IV SOLUTION) IMPLANT
NDL SAFETY ECLIPSE 18X1.5 (NEEDLE) ×1 IMPLANT
NEEDLE 27GAX1X1/2 (NEEDLE) ×2 IMPLANT
NEEDLE HYPO 18GX1.5 SHARP (NEEDLE) ×1
NS IRRIG 1000ML POUR BTL (IV SOLUTION) ×2 IMPLANT
PACK BASIN DAY SURGERY FS (CUSTOM PROCEDURE TRAY) ×2 IMPLANT
PACK ENT DAY SURGERY (CUSTOM PROCEDURE TRAY) ×2 IMPLANT
PAD CLEANER CAUTERY TIP 5X5 (MISCELLANEOUS)
PENCIL FOOT CONTROL (ELECTRODE) ×2 IMPLANT
PISTON CUP LIPPY MOD .4X4.0 SS ×2 IMPLANT
SET EXT MALE ROTATING LL 32IN (MISCELLANEOUS) ×2 IMPLANT
SHEET MEDIUM DRAPE 40X70 STRL (DRAPES) IMPLANT
SLEEVE SCD COMPRESS KNEE MED (MISCELLANEOUS) ×2 IMPLANT
SPONGE SURGIFOAM ABS GEL 12-7 (HEMOSTASIS) ×2 IMPLANT
SUT CHROMIC 4 0 P 3 18 (SUTURE) IMPLANT
SUT PLAIN 5 0 P 3 18 (SUTURE) IMPLANT
TOWEL OR 17X24 6PK STRL BLUE (TOWEL DISPOSABLE) ×4 IMPLANT
TOWEL OR NON WOVEN STRL DISP B (DISPOSABLE) ×2 IMPLANT
TRAY DSU PREP LF (CUSTOM PROCEDURE TRAY) ×2 IMPLANT

## 2013-09-06 NOTE — Op Note (Signed)
OPERATIVE REPORT  DATE OF SURGERY: 09/06/2013  PATIENT:  Patty Blair,  42 y.o. female  PRE-OPERATIVE DIAGNOSIS:  CONDUCTIVE HEARING LOSS   POST-OPERATIVE DIAGNOSIS:  CONDUCTIVE HEARING LOSS   PROCEDURE:  Procedure(s): LEFT STAPEDECTOMY  SURGEON:  Susy Frizzle, MD  ASSISTANTS: none  ANESTHESIA:   General   EBL:  5 ml  DRAINS: XXX   LOCAL MEDICATIONS USED:  1% Xylocaine with epinephrine  SPECIMEN:  Left stapes superstructure  COUNTS:  Correct  PROCEDURE DETAILS: The patient was taken to the operating room and placed on the operating table in the supine position. Following induction of general endotracheal anesthesia, the left ear was prepped and draped in a standard fashion. The operating microscope was used throughout the case. 4 quadrants of the external auditory canal were injected with local anesthetic solution. A tympanomeatal flap was developed starting posteriorly and bringing the flap forward exposing the middle ear. The chorda tympani nerve was identified and was preserved as it ran up superiorly along the bony annulus. The middle ear was inspected. The malleus and incus had normal mobility but the stapes was fixated. A tragal perichondrial graft was then harvested through separate incision. This was cut to size and shape and flattened. The incision was reapproximated with running 5 plain gut suture. Topical adrenaline on cotton balls was used in the middle ear for hemostasis. The bony annulus was reduced using a stapes curette taking care not to damage the chorda tympani nerve. The chorda tympani was Moistened throughout the case. Once the oval window was completely visualized, the procedure then continued. A control hole was placed in the middle of the footplate. There was no perilymph gusher. The incudostapedial joint was divided using a tab knife. The stapedial tendon was cut and the superstructure was down fractured toward the promontory and removed. Mucosa was scraped  off of the footplate, facial nerve canal, and promontory. Gelfoam soaked in saline was packed in the anterior tympanic cavity. The posterior half of the footplate was fractured using a tab knife. The graft was placed into position. A 4 mm Lippy modified bucket-handle prosthesis was then placed into position. It was secured in place with the bucket handle over the incus. Mobility was assessed and there was a nice round window reflex. The tympanomeatal flap was returned to its native position and secured with Gelfoam soaked in Ciprodex. Cotton ball was placed at the extra meatus with bacitracin ointment. Patient was then awakened, extubated and transferred to recovery in stable condition.    PATIENT DISPOSITION:  To PACU, stable

## 2013-09-06 NOTE — Anesthesia Preprocedure Evaluation (Signed)
Anesthesia Evaluation  Patient identified by MRN, date of birth, ID band Patient awake    Reviewed: Allergy & Precautions, H&P , NPO status , Patient's Chart, lab work & pertinent test results  Airway Mallampati: I TM Distance: >3 FB Neck ROM: Full    Dental no notable dental hx. (+) Teeth Intact and Dental Advisory Given   Pulmonary neg pulmonary ROS,  breath sounds clear to auscultation  Pulmonary exam normal       Cardiovascular negative cardio ROS  Rhythm:Regular Rate:Normal     Neuro/Psych negative neurological ROS  negative psych ROS   GI/Hepatic negative GI ROS, Neg liver ROS,   Endo/Other  negative endocrine ROSGestationalneg diabetes  Renal/GU negative Renal ROS  negative genitourinary   Musculoskeletal   Abdominal   Peds  Hematology negative hematology ROS (+)   Anesthesia Other Findings   Reproductive/Obstetrics negative OB ROS                           Anesthesia Physical Anesthesia Plan  ASA: I  Anesthesia Plan: General   Post-op Pain Management:    Induction: Intravenous  Airway Management Planned: Oral ETT  Additional Equipment:   Intra-op Plan:   Post-operative Plan: Extubation in OR  Informed Consent: I have reviewed the patients History and Physical, chart, labs and discussed the procedure including the risks, benefits and alternatives for the proposed anesthesia with the patient or authorized representative who has indicated his/her understanding and acceptance.   Dental advisory given  Plan Discussed with: CRNA  Anesthesia Plan Comments:         Anesthesia Quick Evaluation

## 2013-09-06 NOTE — Anesthesia Postprocedure Evaluation (Signed)
  Anesthesia Post-op Note  Patient: Patty Blair  Procedure(s) Performed: Procedure(s): LEFT STAPEDECTOMY (Left)  Patient Location: PACU  Anesthesia Type:General  Level of Consciousness: awake and alert   Airway and Oxygen Therapy: Patient Spontanous Breathing  Post-op Pain: mild  Post-op Assessment: Post-op Vital signs reviewed, Patient's Cardiovascular Status Stable and Respiratory Function Stable  Post-op Vital Signs: Reviewed  Filed Vitals:   09/06/13 1000  BP: 142/95  Pulse: 112  Temp: 36.8 C  Resp: 16    Complications: No apparent anesthesia complications

## 2013-09-06 NOTE — Interval H&P Note (Signed)
History and Physical Interval Note:  09/06/2013 7:23 AM  Patty Blair  has presented today for surgery, with the diagnosis of CONDUCTIVE HEARING LOSS   The various methods of treatment have been discussed with the patient and family. After consideration of risks, benefits and other options for treatment, the patient has consented to  Procedure(s): LEFT STAPEDECTOMY (Left) as a surgical intervention .  The patient's history has been reviewed, patient examined, no change in status, stable for surgery.  I have reviewed the patient's chart and labs.  Questions were answered to the patient's satisfaction.     [name]

## 2013-09-06 NOTE — Transfer of Care (Signed)
Immediate Anesthesia Transfer of Care Note  Patient: Patty Blair  Procedure(s) Performed: Procedure(s): LEFT STAPEDECTOMY (Left)  Patient Location: PACU  Anesthesia Type:General  Level of Consciousness: awake and patient cooperative  Airway & Oxygen Therapy: Patient Spontanous Breathing and Patient connected to face mask oxygen  Post-op Assessment: Report given to PACU RN and Post -op Vital signs reviewed and stable  Post vital signs: Reviewed and stable  Complications: No apparent anesthesia complications

## 2013-09-06 NOTE — Anesthesia Procedure Notes (Signed)
Procedure Name: Intubation Performed by: Insiya Oshea, Otero Pre-anesthesia Checklist: Patient identified, Emergency Drugs available, Suction available and Patient being monitored Patient Re-evaluated:Patient Re-evaluated prior to inductionOxygen Delivery Method: Circle System Utilized Preoxygenation: Pre-oxygenation with 100% oxygen Intubation Type: IV induction Ventilation: Mask ventilation without difficulty Laryngoscope Size: Mac and 3 Grade View: Grade II Tube type: Oral Tube size: 7.0 mm Number of attempts: 1 Airway Equipment and Method: stylet and oral airway Placement Confirmation: ETT inserted through vocal cords under direct vision,  positive ETCO2 and breath sounds checked- equal and bilateral Tube secured with: Tape Dental Injury: Teeth and Oropharynx as per pre-operative assessment      

## 2013-09-08 ENCOUNTER — Encounter (HOSPITAL_BASED_OUTPATIENT_CLINIC_OR_DEPARTMENT_OTHER): Payer: Self-pay | Admitting: Otolaryngology

## 2014-03-24 IMAGING — US US OB COMP +14 WK
1 series · 12 of 28 positions shown · non-contrast
Comparison: none

[Series 1: us ob comp +14 wk · 12 of 46 slices shown]
[im 2/46]
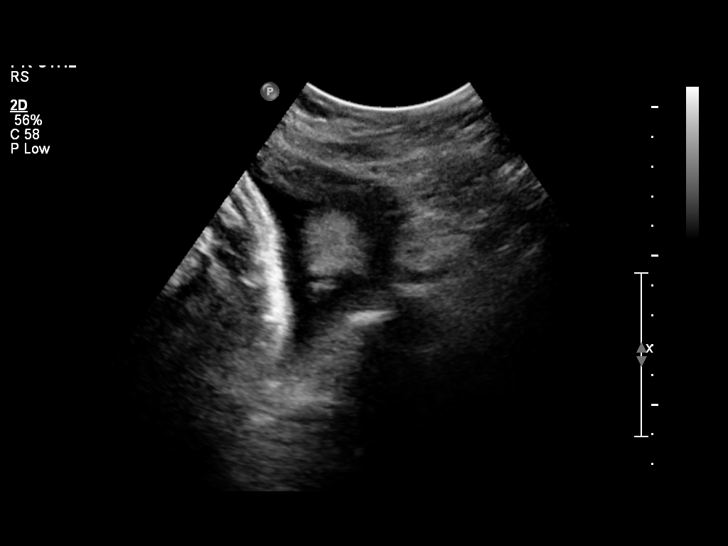
[im 6/46]
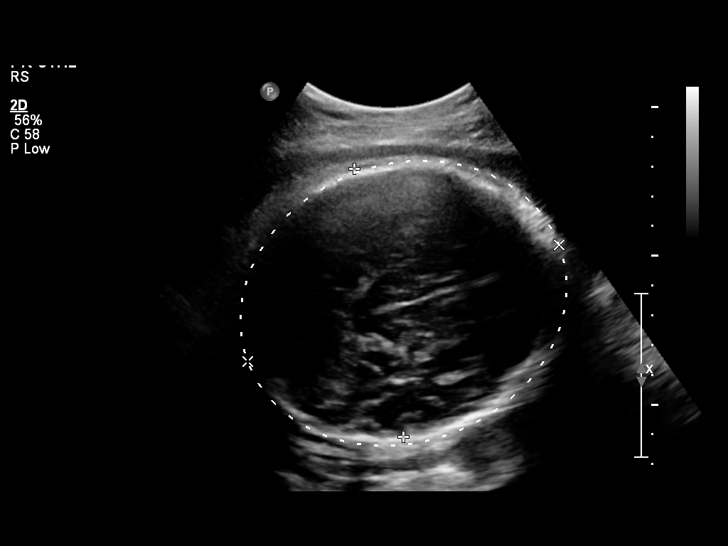
[im 9/46]
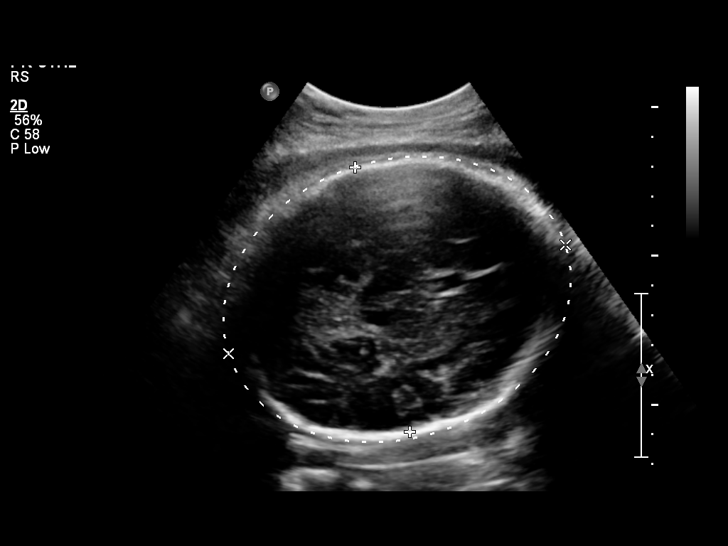
[im 14/46]
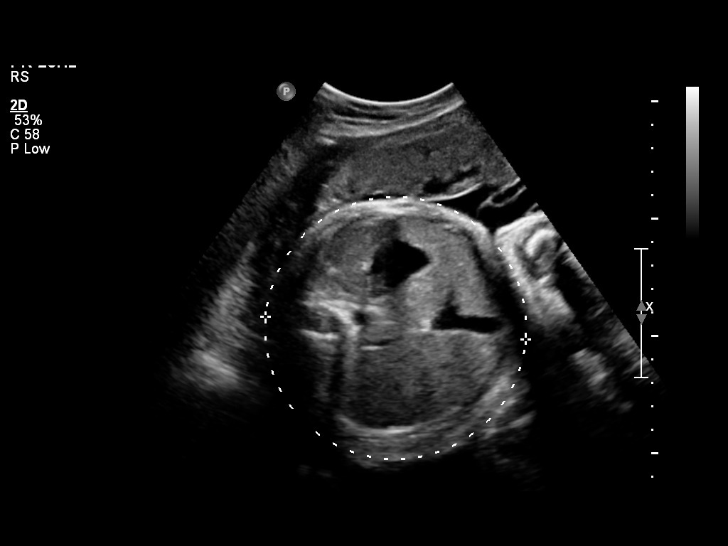
[im 17/46]
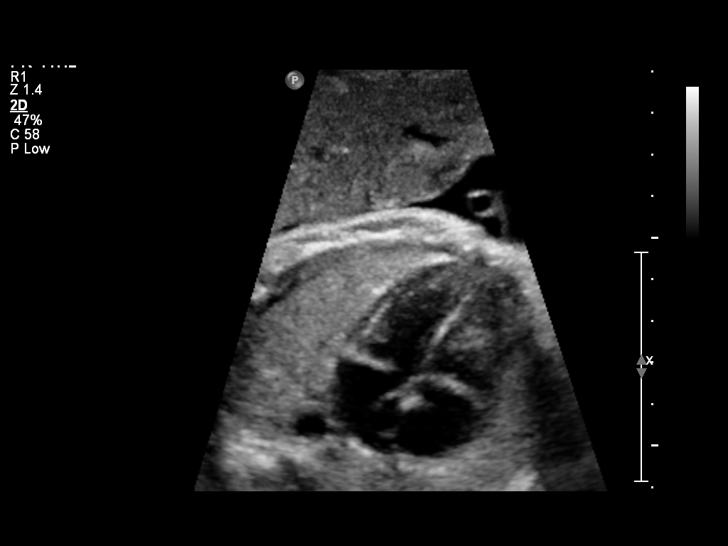
[im 21/46]
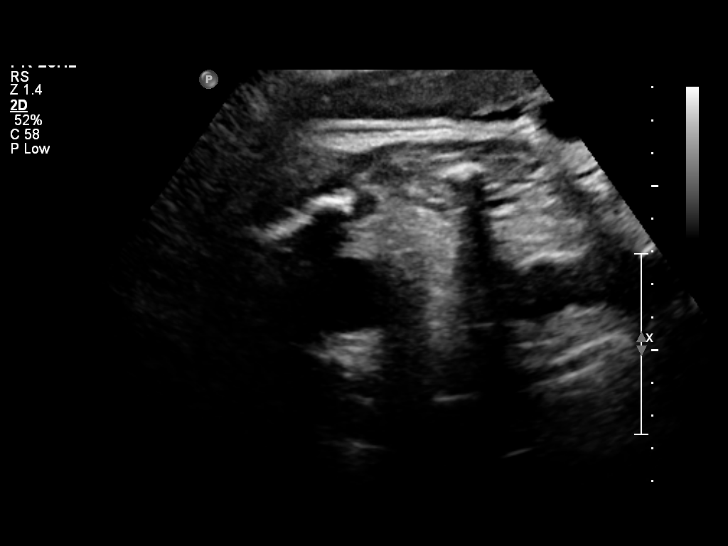
[im 26/46]
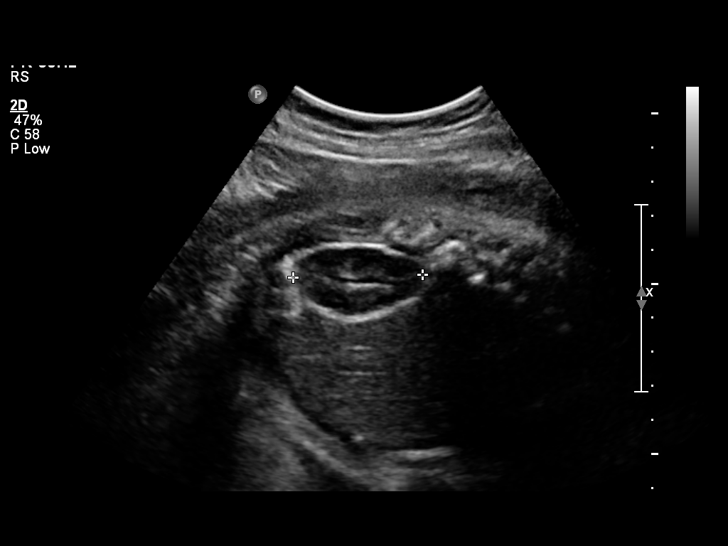
[im 29/46]
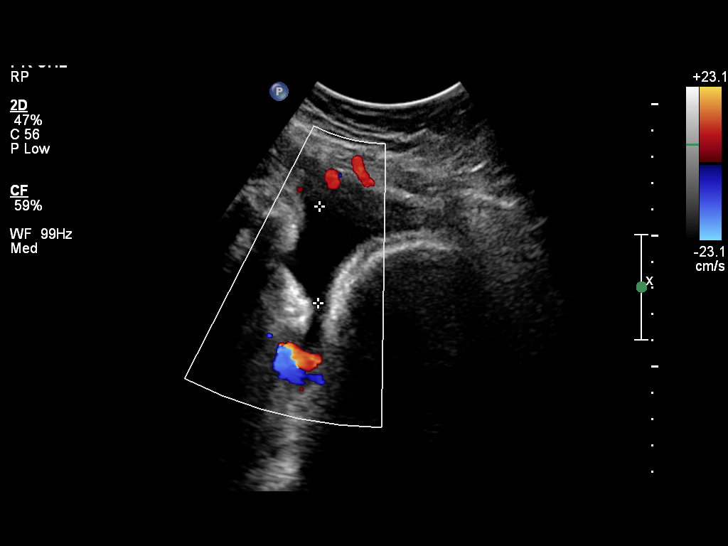
[im 32/46]
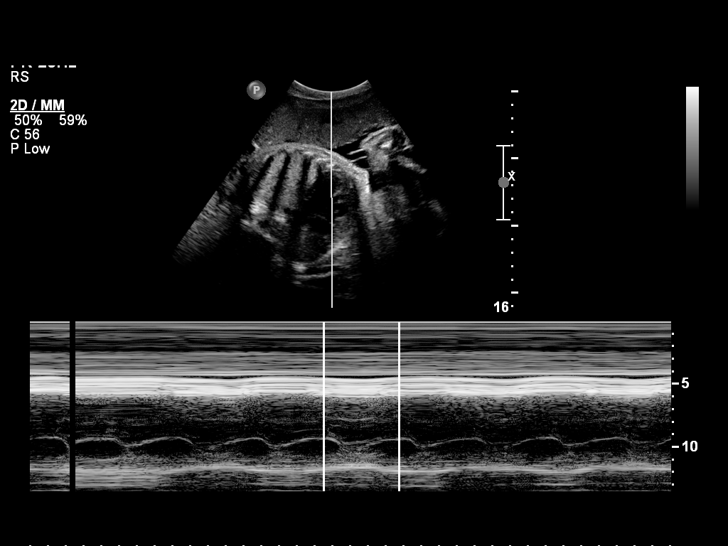
[im 37/46]
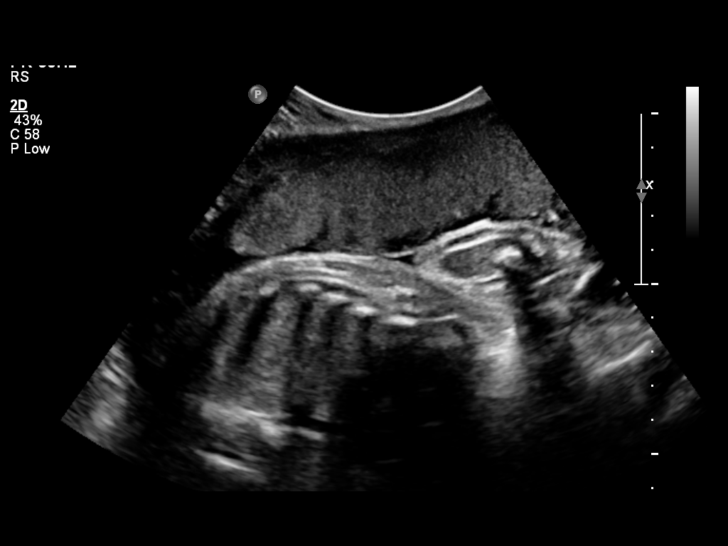
[im 41/46]
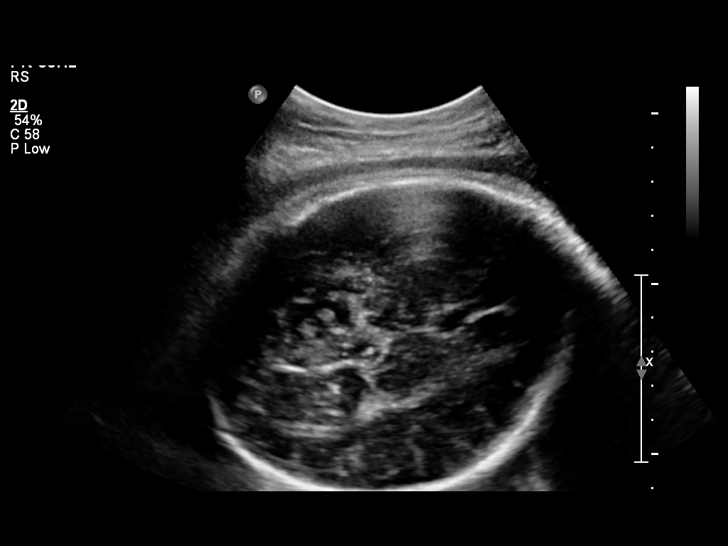
[im 44/46]
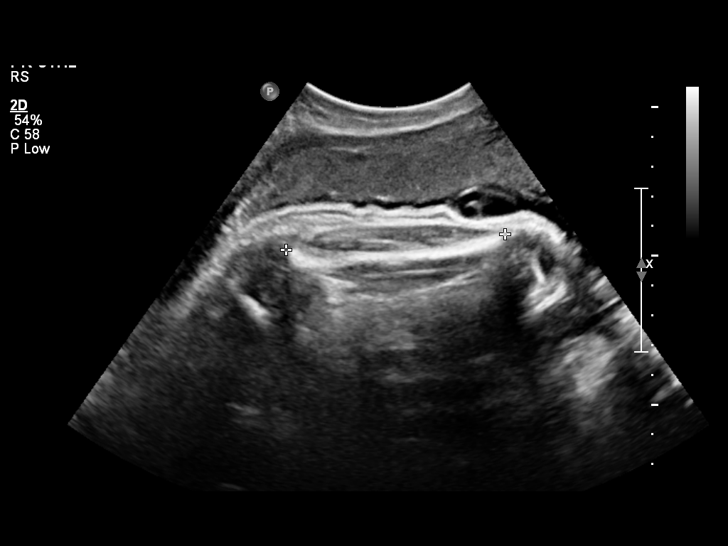

[12 of 28 positions shown; findings below may reference images not displayed]

OBSTETRICS REPORT
                      (Signed Final 11/09/2012 [DATE])

 Name:       HILARIA HAFNER               Visit Date:  11/09/2012 [DATE]

Service(s) Provided

 US OB COMP + 14 WK                                    76805.1
Indications

 Advanced maternal age (AMA), Multigravida
 Basic anatomic survey
 Previous cesarean section
 Poor obstetric history: Previous gestational
 diabetes
 Diabetes - Gestational, A2 (medication controlled)
Fetal Evaluation

 Num Of Fetuses:    1
 Fetal Heart Rate:  142                         bpm
 Cardiac Activity:  Observed
 Presentation:      Cephalic
 Placenta:          Anterior, above cervical os

 Amniotic Fluid
 AFI FV:      Subjectively within normal limits
 AFI Sum:     13.92   cm      51   %Tile     Larg Pckt:     7.5  cm
 RUQ:   1.05   cm    RLQ:    1.7    cm    LUQ:   7.5     cm   LLQ:    3.67   cm
Biometry

 BPD:     90.8  mm    G. Age:   36w 6d                CI:        76.67   70 - 86
                                                      FL/HC:      22.6   20.1 -

 HC:     328.5  mm    G. Age:   37w 2d       44  %    HC/AC:      0.95   0.93 -

 AC:     344.2  mm    G. Age:   38w 2d       95  %    FL/BPD:     81.8   71 - 87
 FL:      74.3  mm    G. Age:   38w 0d       83  %    FL/AC:      21.6   20 - 24

 Est. FW:    1177  gm      7 lb 6 oz     89  %
Gestational Age

 LMP:           36w 3d       Date:   02/28/12                 EDD:   12/04/12
 U/S Today:     37w 4d                                        EDD:   11/26/12
 Best:          36w 3d    Det. By:   LMP  (02/28/12)          EDD:   12/04/12
Anatomy
 Cranium:          Appears normal         Aortic Arch:      Basic anatomy
                                                            exam per order
 Fetal Cavum:      Appears normal         Ductal Arch:      Basic anatomy
                                                            exam per order
 Ventricles:       Appears normal         Diaphragm:        Appears normal
 Choroid Plexus:   Appears normal         Stomach:          Appears normal, left
                                                            sided
 Cerebellum:       Not well visualized    Abdomen:          Appears normal
 Posterior Fossa:  Not well visualized    Abdominal Wall:   Not well visualized
 Nuchal Fold:      Not applicable (>20    Cord Vessels:     Appears normal (3
                   wks GA)                                  vessel cord)
 Face:             Basic anatomy          Kidneys:          Appear normal
                   exam per order
 Lips:             Appears normal         Bladder:          Appears normal
 Heart:            Appears normal         Spine:            Not well visualized
                   (4CH, axis, and
                   situs)
 RVOT:             Not well visualized    Lower             Not well visualized
                                          Extremities:
 LVOT:             Not well visualized    Upper             Not well visualized
                                          Extremities:

 Other:  Male gender. Technically difficult due to advanced GA.
Cervix Uterus Adnexa

 Cervix:       Not visualized (advanced GA >34 wks)

 Adnexa:     No abnormality visualized.
Impression

 Single living IUP.  US EGA is concordant with LMP.
 Suboptimal visualization of anatomy due to advanced GA,
 however no fetal anomalies seen involving visualized
 anatomy.
 Amniotic fluid within normal limits, with AFI of 13.92 cm.

## 2014-08-01 ENCOUNTER — Encounter (HOSPITAL_BASED_OUTPATIENT_CLINIC_OR_DEPARTMENT_OTHER): Payer: Self-pay | Admitting: Otolaryngology

## 2015-12-28 ENCOUNTER — Ambulatory Visit (INDEPENDENT_AMBULATORY_CARE_PROVIDER_SITE_OTHER): Payer: BLUE CROSS/BLUE SHIELD | Admitting: Family Medicine

## 2015-12-28 VITALS — BP 116/80 | HR 90 | Temp 98.2°F | Resp 18 | Ht 64.0 in | Wt 174.0 lb

## 2015-12-28 DIAGNOSIS — R5383 Other fatigue: Secondary | ICD-10-CM | POA: Diagnosis not present

## 2015-12-28 DIAGNOSIS — R05 Cough: Secondary | ICD-10-CM | POA: Diagnosis not present

## 2015-12-28 DIAGNOSIS — M791 Myalgia, unspecified site: Secondary | ICD-10-CM

## 2015-12-28 DIAGNOSIS — R059 Cough, unspecified: Secondary | ICD-10-CM

## 2015-12-28 DIAGNOSIS — J069 Acute upper respiratory infection, unspecified: Secondary | ICD-10-CM | POA: Diagnosis not present

## 2015-12-28 LAB — POCT INFLUENZA A/B
INFLUENZA A, POC: NEGATIVE
Influenza B, POC: NEGATIVE

## 2015-12-28 MED ORDER — IBUPROFEN 200 MG PO TABS
600.0000 mg | ORAL_TABLET | Freq: Once | ORAL | Status: AC
  Administered 2015-12-28: 600 mg via ORAL

## 2015-12-28 MED ORDER — ALBUTEROL SULFATE HFA 108 (90 BASE) MCG/ACT IN AERS: 2.0000 | INHALATION_SPRAY | RESPIRATORY_TRACT | Status: DC | PRN

## 2015-12-28 NOTE — Progress Notes (Signed)
Subjective:    Patient ID: Patty Blair, female    DOB: 1970/12/19, 45 y.o.   MRN: [id number]  HPI This is a pleasant 45 yo female who presents today with 3-4 days of nasal congestion and shortness of breath. Non productive cough. Feels her heart beating fast. Feels achy and tired. Headache. Has been using over the counter allergy medicine and nasal spray. No wheezing. Sleeping ok. Has required albuterol inhaler in past with upper respiratory symptoms.   No sick contacts.    Past Medical History  Diagnosis Date  . No pertinent past medical history   . Gestational diabetes   . Heartburn in pregnancy   . Allergy    Past Surgical History  Procedure Laterality Date  . Cesarean section    . Cesarean section N/A 11/30/2012    Procedure: CESAREAN SECTION;  Surgeon: Lahoma Crocker, MD;  Location: [facility];  Service: Obstetrics;  Laterality: N/A;  . Stapedectomy Left 09/06/2013    Procedure: LEFT STAPEDECTOMY;  Surgeon: Izora Gala, MD;  Location: [facility];  Service: ENT;  Laterality: Left;   Family History  Problem Relation Age of Onset  . Anesthesia problems Neg Hx   . Hypotension Neg Hx   . Malignant hyperthermia Neg Hx   . Pseudochol deficiency Neg Hx    Social History  Substance Use Topics  . Smoking status: Never Smoker   . Smokeless tobacco: Never Used  . Alcohol Use: No    Review of Systems Per HPI    Objective:   Physical Exam  Constitutional: She is oriented to person, place, and time. She appears well-developed and well-nourished. She appears ill. No distress.  HENT:  Head: Normocephalic and atraumatic.  Right Ear: Tympanic membrane, external ear and ear canal normal.  Left Ear: Tympanic membrane, external ear and ear canal normal.  Nose: Mucosal edema and rhinorrhea present.  Mouth/Throat: Uvula is midline.  Post nasal drainage  Eyes: Conjunctivae are normal.  Neck: Normal range of motion. Neck supple.  Cardiovascular: Normal rate, regular  rhythm and normal heart sounds.   Pulmonary/Chest: Effort normal and breath sounds normal.  Musculoskeletal: Normal range of motion.  Lymphadenopathy:    She has no cervical adenopathy.  Neurological: She is alert and oriented to person, place, and time.  Skin: Skin is warm and dry. She is not diaphoretic.  Psychiatric: She has a normal mood and affect. Her behavior is normal. Thought content normal.  Vitals reviewed.     BP 116/80 mmHg  Pulse 90  Temp(Src) 98.2 F (36.8 C) (Oral)  Resp 18  Ht 5' 4"  (1.626 m)  Wt 174 lb (78.926 kg)  BMI 29.85 kg/m2  SpO2 99%  LMP 12/07/2015 Wt Readings from Last 3 Encounters:  12/28/15 174 lb (78.926 kg)  09/06/13 160 lb 9.6 oz (72.848 kg)  01/25/13 157 lb (71.215 kg)   Results for orders placed or performed in visit on 12/28/15  POCT Influenza A/B  Result Value Ref Range   Influenza A, POC Negative Negative   Influenza B, POC Negative Negative       Assessment & Plan:  1. Myalgia - POCT Influenza A/B - ibuprofen (ADVIL,MOTRIN) tablet 600 mg; Take 3 tablets (600 mg total) by mouth once.  2. Other fatigue - POCT Influenza A/B  3. Cough - POCT Influenza A/B - albuterol (PROVENTIL HFA;VENTOLIN HFA) 108 (90 Base) MCG/ACT inhaler; Inhale 2 puffs into the lungs every 4 (four) hours as needed for wheezing or shortness of  breath (cough, shortness of breath or wheezing.).  Dispense: 1 Inhaler; Refill: 1  4. Upper respiratory infection with cough and congestion - suspect viral illness with reactive airway - Provided written and verbal information regarding diagnosis and treatment. - RTC precautions reviewed    Clarene Reamer, FNP-BC  [facility]  12/30/2015 11:48 AM

## 2015-12-28 NOTE — Patient Instructions (Addendum)
Keep taking your flonase nasal spray and allergy medication  For muscle aches, headache, sore throat you can take over the counter acetaminophen or ibuprofen as directed on the package For nasal congestion you can use Afrin nasal spray twice a day for up to 3 days, and /or sudafed, and/or saline nasal spray For cough you can use Delsym cough syrup Please come back to see [place] or go to the emergency department if you are not better in 5 to 7 days or if you develop fever over 101 for more than 48 hours or if you develop wheezing or shortness of breath.      IF you received an x-ray today, you will receive an invoice from [facility]. Please contact [facility] at [phone] with questions or concerns regarding your invoice.   IF you received labwork today, you will receive an invoice from [employer]. Please contact [facility] at [phone] with questions or concerns regarding your invoice.   Our billing staff will not be able to assist you with questions regarding bills from these companies.  You will be contacted with the lab results as soon as they are available. The fastest way to get your results is to activate your My Chart account. Instructions are located on the last page of this paperwork. If you have not heard from [place] regarding the results in 2 weeks, please contact this office.

## 2015-12-30 ENCOUNTER — Encounter: Payer: Self-pay | Admitting: Family Medicine

## 2016-10-31 ENCOUNTER — Emergency Department (HOSPITAL_COMMUNITY)
Admission: EM | Admit: 2016-10-31 | Discharge: 2016-10-31 | Disposition: A | Discharge status: Home / Self Care | Payer: Medicaid Other | Attending: Emergency Medicine | Admitting: Emergency Medicine

## 2016-10-31 ENCOUNTER — Emergency Department (HOSPITAL_COMMUNITY): Payer: Medicaid Other

## 2016-10-31 ENCOUNTER — Encounter (HOSPITAL_COMMUNITY): Payer: Self-pay | Admitting: Emergency Medicine

## 2016-10-31 DIAGNOSIS — J029 Acute pharyngitis, unspecified: Secondary | ICD-10-CM | POA: Insufficient documentation

## 2016-10-31 DIAGNOSIS — R6889 Other general symptoms and signs: Secondary | ICD-10-CM

## 2016-10-31 DIAGNOSIS — R05 Cough: Secondary | ICD-10-CM | POA: Insufficient documentation

## 2016-10-31 MED ORDER — ACETAMINOPHEN 325 MG PO TABS
650.0000 mg | ORAL_TABLET | Freq: Once | ORAL | Status: AC | PRN
  Administered 2016-10-31: 650 mg via ORAL
  Filled 2016-10-31: qty 2

## 2016-10-31 MED ORDER — IBUPROFEN 800 MG PO TABS
800.0000 mg | ORAL_TABLET | Freq: Once | ORAL | Status: AC
  Administered 2016-10-31: 800 mg via ORAL
  Filled 2016-10-31: qty 1

## 2016-10-31 NOTE — ED Triage Notes (Signed)
Pt reports cough, runny nose, sore throat, and feeling feverish for the past 2 days.

## 2016-10-31 NOTE — ED Provider Notes (Signed)
[facility] DEPT Provider Note   CSN: [id number] Arrival date & time: 10/31/16  1019  By signing my name below, I, Sonum Patel, attest that this documentation has been prepared under the direction and in the presence of [employer], PA-C. Electronically Signed: Sonum Patel, Education administrator. 10/31/16. 2:06 PM.  History   Chief Complaint Chief Complaint  Patient presents with  . Cough  . Sore Throat    The history is provided by the patient. No language interpreter was used.     HPI Comments: Patty Blair is a 46 y.o. female who presents to the Emergency Department complaining of persistent cough productive of sputum for the past 2 days. She has associated fever, chills, generalized myalgia, rhinorrhea, sore throat for the same period of time. She has tried OTC medications without relief. She denies CP, SOB, wheezing, HA. She denies history of chronic conditions requiring medications. Has not had her flu shot this year. No known sick contacts.   Past Medical History:  Diagnosis Date  . Allergy   . Gestational diabetes   . Heartburn in pregnancy     Patient Active Problem List   Diagnosis Date Noted  . 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 Routine postpartum follow-up 01/25/2013  . 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 Diabetes mellitus, antepartum(648.03) 11/28/2012  . Previous cesarean section 11/28/2012    Past Surgical History:  Procedure Laterality Date  . CESAREAN SECTION    . CESAREAN SECTION N/A 11/30/2012   Procedure: CESAREAN SECTION;  Surgeon: Lahoma Crocker, MD;  Location: [facility];  Service: Obstetrics;  Laterality: N/A;  . STAPEDECTOMY Left 09/06/2013   Procedure: LEFT STAPEDECTOMY;  Surgeon: Izora Gala, MD;  Location: [facility];  Service: ENT;  Laterality: Left;    OB History    Gravida Para Term Preterm AB Living   5 4 4  0 0 4   SAB TAB Ectopic Multiple Live Births   0 0 0 0 1       Home Medications    Prior to Admission  medications   Medication Sig Start Date End Date Taking? Authorizing Provider  albuterol (PROVENTIL HFA;VENTOLIN HFA) 108 (90 Base) MCG/ACT inhaler Inhale 2 puffs into the lungs every 4 (four) hours as needed for wheezing or shortness of breath (cough, shortness of breath or wheezing.). 12/28/15   Elby Beck, FNP    Family History Family History  Problem Relation Age of Onset  . Anesthesia problems Neg Hx   . Hypotension Neg Hx   . Malignant hyperthermia Neg Hx   . Pseudochol deficiency Neg Hx     Social History Social History  Substance Use Topics  . Smoking status: Never Smoker  . Smokeless tobacco: Never Used  . Alcohol use No     Allergies   Omnicef [cefdinir]   Review of Systems Review of Systems  Constitutional: Positive for chills and fever.  HENT: Positive for congestion, rhinorrhea and sore throat.   Respiratory: Positive for cough. Negative for shortness of breath.   Musculoskeletal: Positive for myalgias.  Neurological: Negative for headaches.  All other systems reviewed and are negative.    Physical Exam Updated Vital Signs BP 120/85 (BP Location: Left Arm)   Pulse (!) 121   Temp 101.4 F (38.6 C) (Oral)   Resp 18   SpO2 98%   Physical Exam  Constitutional: She is oriented to person, place, and time. She appears well-developed and well-nourished.  Ill appearing. Fatigued   HENT:  Head: Normocephalic and atraumatic.  Right Ear: External ear normal.  Left Ear: External ear normal.  Nose: Mucosal edema present.  Mouth/Throat: Oropharynx is clear and moist. No oropharyngeal exudate.  Nasal mucosa edema   Neck: Normal range of motion. Neck supple.  Cardiovascular: Regular rhythm and normal heart sounds.  Tachycardia present.   Pulmonary/Chest: Effort normal and breath sounds normal. No respiratory distress. She has no wheezes. She has no rales.  Abdominal: Soft. She exhibits no distension. There is no tenderness.  Musculoskeletal: Normal range  of motion. She exhibits no deformity.  Lymphadenopathy:    She has no cervical adenopathy.  Neurological: She is alert and oriented to person, place, and time.  Skin: Skin is warm and dry.  Psychiatric: She has a normal mood and affect. Her behavior is normal.  Nursing note and vitals reviewed.    ED Treatments / Results  DIAGNOSTIC STUDIES: Oxygen Saturation is 98% on RA, normal by my interpretation.    COORDINATION OF CARE: 2:05 PM Discussed treatment plan with pt at bedside and pt agreed to plan.   Labs (all labs ordered are listed, but only abnormal results are displayed) Labs Reviewed - No data to display  EKG  EKG Interpretation None       Radiology Dg Chest 2 View  Result Date: 10/31/2016 CLINICAL DATA:  Cough, runny nose, sore throat EXAM: CHEST  2 VIEW COMPARISON:  None. FINDINGS: The heart size and mediastinal contours are within normal limits. Both lungs are clear. The visualized skeletal structures are unremarkable. IMPRESSION: No active cardiopulmonary disease. Electronically Signed   By: Kathreen Devoid   On: 10/31/2016 14:35    Procedures Procedures (including critical care time)  Medications Ordered in ED Medications  acetaminophen (TYLENOL) tablet 650 mg (650 mg Oral Given 10/31/16 1251)  ibuprofen (ADVIL,MOTRIN) tablet 800 mg (800 mg Oral Given 10/31/16 1513)     Initial Impression / Assessment and Plan / ED Course  I have reviewed the triage vital signs and the nursing notes.  Pertinent labs & imaging results that were available during my care of the patient were reviewed by me and considered in my medical decision making (see chart for details).  Patient with symptoms consistent with influenza.  She is initially tachycardic and febrile. No signs of dehydration, tolerating PO's.  Lungs are clear. CXR ordered to r/o PNA. Discussed the cost versus benefit of Tamiflu treatment with the patient. She declines tx with Tamiflu.   After oral fluids and Tylenol  and Motrin tachycardia is improved and fever has resolved. Patient will be discharged with instructions to orally hydrate, rest, and use over-the-counter medications such as anti-inflammatories ibuprofen and Aleve for muscle aches and Tylenol for fever.   Final Clinical Impressions(s) / ED Diagnoses   Final diagnoses:  Flu-like symptoms    New Prescriptions New Prescriptions   No medications on file   I personally performed the services described in this documentation, which was scribed in my presence. The recorded information has been reviewed and is accurate.    Recardo Evangelist, PA-C 10/31/16 [place], MD 11/02/16 [phone]

## 2016-10-31 NOTE — ED Notes (Signed)
Patient's vital signs were reassessed. Due to increased pulse and temp, informed RN Frederico Hamman about change.

## 2016-10-31 NOTE — Discharge Instructions (Signed)
Take Tylenol and/or Ibuprofen for pain and fever Drink plenty of fluids and rest Return for worsening symptoms

## 2017-09-10 ENCOUNTER — Other Ambulatory Visit: Payer: Self-pay | Admitting: Otolaryngology

## 2017-12-18 ENCOUNTER — Ambulatory Visit (INDEPENDENT_AMBULATORY_CARE_PROVIDER_SITE_OTHER): Payer: Medicaid Other | Admitting: Ophthalmology

## 2017-12-18 ENCOUNTER — Encounter (INDEPENDENT_AMBULATORY_CARE_PROVIDER_SITE_OTHER): Payer: Self-pay | Admitting: Ophthalmology

## 2017-12-18 DIAGNOSIS — H33321 Round hole, right eye: Secondary | ICD-10-CM

## 2017-12-18 DIAGNOSIS — H3581 Retinal edema: Secondary | ICD-10-CM | POA: Diagnosis not present

## 2017-12-18 DIAGNOSIS — E119 Type 2 diabetes mellitus without complications: Secondary | ICD-10-CM | POA: Diagnosis not present

## 2017-12-18 DIAGNOSIS — H35413 Lattice degeneration of retina, bilateral: Secondary | ICD-10-CM | POA: Diagnosis not present

## 2017-12-18 DIAGNOSIS — H25813 Combined forms of age-related cataract, bilateral: Secondary | ICD-10-CM

## 2017-12-18 MED ORDER — PREDNISOLONE ACETATE 1 % OP SUSP: 1.0000 [drp] | Freq: Four times a day (QID) | OPHTHALMIC | 0 refills | Status: AC

## 2017-12-18 NOTE — Progress Notes (Signed)
[facility] Note  12/18/2017     CHIEF COMPLAINT Patient presents for Retina Evaluation and Diabetic Eye Exam   HISTORY OF PRESENT ILLNESS: Patty Blair is a 47 y.o. female who presents to the clinic today for:   HPI    Retina Evaluation    In both eyes.  This started 2 months ago.  Associated Symptoms Negative for Flashes, Blind Spot, Glare, Shoulder/Hip pain, Fatigue, Jaw Claudication, Photophobia, Distortion, Floaters, Redness, Scalp Tenderness, Weight Loss, Fever, Trauma and Pain.  Context:  distance vision, mid-range vision, near vision, driving and night driving.  Treatments tried include no treatments.  I, the attending physician,  performed the HPI with the patient and updated documentation appropriately.          Diabetic Eye Exam    Vision is stable.  Associated Symptoms Negative for Blind Spot, Glare, Shoulder/Hip pain, Fatigue, Jaw Claudication, Photophobia, Distortion, Floaters, Redness, Scalp Tenderness, Weight Loss, Fever, Trauma, Pain and Flashes.  Diabetes characteristics include controlled with diet.  This started 6 months ago.  Last A1C 7.  I, the attending physician,  performed the HPI with the patient and updated documentation appropriately.          Comments    Referral Of Dr. Katy Fitch for retina eval/DME. Patient states she has noticed in the last couple months her vision has become blurry OU, more so OD, she has light sensitivity with night driving. Pt reports she is was Dx with DM2 appx six months ago, Her Bs are controlled by diet, she does not  routinely monitor her Bs at home, A1C 7.0 (10/2017). Denies Vi'ts/gtt's       Last edited by Bernarda Caffey, MD on 12/18/2017  1:18 PM. (History)    Pt states she was seen by Dr. Zenia Resides for routine exam to establish care; Pt denies floaters, denies flashes, denies wavy VA;   Referring physician: Debbra Riding, MD [address] [place], [place] [address]  HISTORICAL  INFORMATION:   Selected notes from the MEDICAL RECORD NUMBER Referred by Dr. Zenia Resides for concern of retinal hole LEE-  Ocular Hx-  PMH-     CURRENT MEDICATIONS: Current Outpatient Medications (Ophthalmic Drugs)  Medication Sig  . prednisoLONE acetate (PRED FORTE) 1 % ophthalmic suspension Place 1 drop into the right eye 4 (four) times daily for 7 days.   No current facility-administered medications for this visit.  (Ophthalmic Drugs)   Current Outpatient Medications (Other)  Medication Sig  . albuterol (PROVENTIL HFA;VENTOLIN HFA) 108 (90 Base) MCG/ACT inhaler Inhale 2 puffs into the lungs every 4 (four) hours as needed for wheezing or shortness of breath (cough, shortness of breath or wheezing.). (Patient not taking: Reported on 12/18/2017)   No current facility-administered medications for this visit.  (Other)      REVIEW OF SYSTEMS: ROS    Positive for: Endocrine, Eyes   Negative for: Constitutional, Gastrointestinal, Neurological, Skin, Genitourinary, Musculoskeletal, HENT, Cardiovascular, Respiratory, Psychiatric, Allergic/Imm, Heme/Lymph   Last edited by Zenovia Jordan, LPN on 3/54/5625  6:38 PM. (History)       ALLERGIES Allergies  Allergen Reactions  . Omnicef [Cefdinir] Swelling    PAST MEDICAL HISTORY Past Medical History:  Diagnosis Date  . Allergy   . Gestational diabetes   . Heartburn in pregnancy    Past Surgical History:  Procedure Laterality Date  . CESAREAN SECTION    . CESAREAN SECTION N/A 11/30/2012   Procedure: CESAREAN SECTION;  Surgeon: Lahoma Crocker, MD;  Location: [facility];  Service: Obstetrics;  Laterality: N/A;  . STAPEDECTOMY Left 09/06/2013   Procedure: LEFT STAPEDECTOMY;  Surgeon: Izora Gala, MD;  Location: [facility];  Service: ENT;  Laterality: Left;    FAMILY HISTORY Family History  Problem Relation Age of Onset  . Anesthesia problems Neg Hx   . Hypotension Neg Hx   . Malignant hyperthermia Neg Hx   .  Pseudochol deficiency Neg Hx     SOCIAL HISTORY Social History   Tobacco Use  . Smoking status: Never Smoker  . Smokeless tobacco: Never Used  Substance Use Topics  . Alcohol use: No  . Drug use: No         OPHTHALMIC EXAM:  Base Eye Exam    Visual Acuity (Snellen - Linear)      Right Left   Dist cc 20/20 -1 20/20 -2   Correction:  Glasses       Tonometry (Tonopen, 1:10 PM)      Right Left   Pressure 17 15       Pupils      Dark Light Shape React APD   Right 8 8 Round Minimal None   Left 8 8 Round Minimal None       Visual Fields (Counting fingers)      Left Right    Full Full       Extraocular Movement      Right Left    Full, Ortho Full, Ortho       Neuro/Psych    Oriented x3:  Yes   Mood/Affect:  Normal       Dilation    Both eyes:  1.0% Mydriacyl, 2.5% Phenylephrine @ 1:10 PM        Slit Lamp and Fundus Exam    Slit Lamp Exam      Right Left   Lids/Lashes Normal Normal   Conjunctiva/Sclera Mild Melanosis, Nasal Pinguecula Nasal Pinguecula, pigmented pinguecula temproally, mild Melanosis   Cornea Inferior 2+ Punctate epithelial erosions Inferior 2+ Punctate epithelial erosions   Anterior Chamber Deep and quiet Deep and quiet   Iris Round and dilated, No NVI Round and dilated, No NVI   Lens 1+ Nuclear sclerosis, 2+ Cortical cataract 1+ Nuclear sclerosis, 2+ Cortical cataract   Vitreous Normal Normal       Fundus Exam      Right Left   Disc Normal, mild temporal peripapillary pigmentation Normal   C/D Ratio 0.4 0.4   Macula Good foveal reflex, Retinal pigment epithelial mottling, No heme or edema Good foveal reflex, Retinal pigment epithelial mottling, No heme or edema   Vessels Normal Normal   Periphery Attached, Inferior Lattice degeneration, atrophic hole at 0600, round operculated hole at 0730 - no SRF, peripheral cystoid degeneration Attached, Inferior Lattice degeneration, peripheral cystoid degeneration        Refraction     Wearing Rx      Sphere Cylinder Axis   Right -1.25 +0.75 010   Left -1.00 +0.50 165       Manifest Refraction (Over)      Sphere Cylinder Axis Dist VA   Right -0.75 +0.75 010 20/20   Left -1.00 +0.75 165 20/20          IMAGING AND PROCEDURES  Imaging and Procedures for 12/19/17  OCT, Retina - OU - Both Eyes       Right Eye Quality was good. Central Foveal Thickness: 233. Progression has no prior  data. Findings include normal foveal contour, no IRF, no SRF.   Left Eye Quality was good. Central Foveal Thickness: 242. Progression has no prior data. Findings include normal foveal contour, no IRF, no SRF.   Notes *Images captured and stored on drive  Diagnosis / Impression:  NFP, No IRF/SRF OU  Clinical management:  See below  Abbreviations: NFP - Normal foveal profile. CME - cystoid macular edema. PED - pigment epithelial detachment. IRF - intraretinal fluid. SRF - subretinal fluid. EZ - ellipsoid zone. ERM - epiretinal membrane. ORA - outer retinal atrophy. ORT - outer retinal tubulation. SRHM - subretinal hyper-reflective material         Repair Retinal Breaks, Laser - OD - Right Eye       LASER PROCEDURE NOTE  Procedure:  Barrier laser retinopexy using slit lamp laser, RIGHT eye   Diagnosis:   Retinal hole, RIGHT eye                     Operculated retinal hole at 0730                         Lattice degeneration w/ atrophic holes, inf temp quadrant  Surgeon: Bernarda Caffey, MD, PhD  Anesthesia: Topical  Informed consent obtained, operative eye marked, and time out performed prior to initiation of laser.   Laser settings:  Lumenis [id number] laser, slit lamp Lens: Mainster PRP 165 Power: 220 mW Spot size: 200 microns Duration: 30 msec  # spots: 266  Placement of laser: Using a Mainster PRP 165 contact lens at the slit lamp, laser was placed in three confluent rows around operculated retinal hole at 730 and posterior to lattice degeneration in inf temp  quadrant.  Complications: None.  Patient tolerated the procedure well and received written and verbal post-procedure care information/education.                  ASSESSMENT/PLAN:    ICD-10-CM   1. 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 Lattice degeneration of both retinas H35.413   3. Diabetes mellitus type 2 without retinopathy ([place]) E11.9   4. Retinal edema H35.81 OCT, Retina - OU - Both Eyes  5. Combined forms of age-related cataract of both eyes H25.813     1,2. Lattice degeneration w/ atrophic holes, OD - atrophic hole at 0600, round operculated hole at 0730 -- no SRF, asymptomatic - discussed findings, prognosis, and treatment options including observation - recommend laser retinopexy - pt wishes to proceed with laser - RBA of procedure discussed, questions answered - informed consent obtained and signed - see procedure note - start PF QID OD x7 days - f/u in 2 wks, sooner prn  3. Diabetes mellitus, type 2 without retinopathy - The incidence, risk factors for progression, natural history and treatment options for diabetic retinopathy  were discussed with patient.   - The need for close monitoring of blood glucose, blood pressure, and serum lipids, avoiding cigarette or any type of tobacco, and the need for long term follow up was also discussed with patient. - f/u in 1 year, sooner prn  4. No retinal edema on exam or OCT  5. Combined form age-related cataract OU-  - The symptoms of cataract, surgical options, and treatments and risks were discussed with patient. - discussed diagnosis and progression - not yet visually significant - monitor for now   Ophthalmic Meds Ordered this visit:  Meds ordered this encounter  Medications  . prednisoLONE acetate (PRED FORTE) 1 % ophthalmic suspension    Sig: Place 1 drop into the right eye 4 (four) times daily for 7 days.    Dispense:  10 mL    Refill:  0       Return for 2  wks, POV.  There are no Patient Instructions on file for this visit.   Explained the diagnoses, plan, and follow up with the patient and they expressed understanding.  Patient expressed understanding of the importance of proper follow up care.   This document serves as a record of services personally performed by Gardiner Sleeper, MD, PhD. It was created on their behalf by Catha Brow, [place], a certified ophthalmic assistant. The creation of this record is the provider's dictation and/or activities during the visit.  Electronically signed by: Catha Brow, [place]  12/19/17 1:04 AM   Gardiner Sleeper, M.D., Ph.D. Diseases & Surgery of the Retina and [place] 12/19/17  I have reviewed the above documentation for accuracy and completeness, and I agree with the above. Gardiner Sleeper, M.D., Ph.D. 12/19/17 1:04 AM     Abbreviations: M myopia (nearsighted); A astigmatism; H hyperopia (farsighted); P presbyopia; Mrx spectacle prescription;  CTL contact lenses; OD right eye; OS left eye; OU both eyes  XT exotropia; ET esotropia; PEK punctate epithelial keratitis; PEE punctate epithelial erosions; DES dry eye syndrome; MGD meibomian gland dysfunction; ATs artificial tears; PFAT's preservative free artificial tears; [place] nuclear sclerotic cataract; PSC posterior subcapsular cataract; ERM epi-retinal membrane; PVD posterior vitreous detachment; RD retinal detachment; DM diabetes mellitus; DR diabetic retinopathy; NPDR non-proliferative diabetic retinopathy; PDR proliferative diabetic retinopathy; CSME clinically significant macular edema; DME diabetic macular edema; dbh dot blot hemorrhages; CWS cotton wool spot; POAG primary open angle glaucoma; C/D cup-to-disc ratio; HVF humphrey visual field; GVF goldmann visual field; OCT optical coherence tomography; IOP intraocular pressure; BRVO Branch retinal vein occlusion; CRVO central retinal vein occlusion; CRAO central  retinal artery occlusion; BRAO branch retinal artery occlusion; RT retinal tear; SB scleral buckle; PPV pars plana vitrectomy; VH Vitreous hemorrhage; PRP panretinal laser photocoagulation; IVK intravitreal kenalog; VMT vitreomacular traction; MH Macular hole;  NVD neovascularization of the disc; NVE neovascularization elsewhere; AREDS age related eye disease study; ARMD age related macular degeneration; POAG primary open angle glaucoma; EBMD epithelial/anterior basement membrane dystrophy; ACIOL anterior chamber intraocular lens; IOL intraocular lens; PCIOL posterior chamber intraocular lens; Phaco/IOL phacoemulsification with intraocular lens placement; [place] photorefractive keratectomy; LASIK laser assisted in situ keratomileusis; HTN hypertension; DM diabetes mellitus; COPD chronic obstructive pulmonary disease

## 2017-12-19 ENCOUNTER — Encounter (INDEPENDENT_AMBULATORY_CARE_PROVIDER_SITE_OTHER): Payer: Self-pay | Admitting: Ophthalmology

## 2017-12-30 NOTE — Progress Notes (Signed)
[facility] Note  01/01/2018     CHIEF COMPLAINT Patient presents for Retina Follow Up   HISTORY OF PRESENT ILLNESS: Patty Blair is a 47 y.o. female who presents to the clinic today for:   HPI    Retina Follow Up    Patient presents with  Other.  In right eye.  This started 2 weeks ago.  Severity is mild.  Since onset it is stable.  I, the attending physician,  performed the HPI with the patient and updated documentation appropriately.          Comments    F/U laser retinopexy OD. Patient states her right eye sometimes has achy feeling, vision is stable. Denies flashes and floaters. Pt. Denies hypo/hyper glycemic episodes.        Last edited by Bernarda Caffey, MD on 01/01/2018  8:52 AM. (History)    Pt states she tolerated laser well;   Referring physician: Debbra Riding, MD [address] [place], [place] [address]  HISTORICAL INFORMATION:   Selected notes from the MEDICAL RECORD NUMBER Referred by Dr. Zenia Resides for concern of retinal hole LEE-  Ocular Hx-  PMH-     CURRENT MEDICATIONS: No current outpatient medications on file. (Ophthalmic Drugs)   No current facility-administered medications for this visit.  (Ophthalmic Drugs)   Current Outpatient Medications (Other)  Medication Sig  . albuterol (PROVENTIL HFA;VENTOLIN HFA) 108 (90 Base) MCG/ACT inhaler Inhale 2 puffs into the lungs every 4 (four) hours as needed for wheezing or shortness of breath (cough, shortness of breath or wheezing.). (Patient not taking: Reported on 01/01/2018)   No current facility-administered medications for this visit.  (Other)      REVIEW OF SYSTEMS: ROS    Positive for: Endocrine, Eyes   Negative for: Constitutional, Gastrointestinal, Neurological, Skin, Genitourinary, Musculoskeletal, HENT, Cardiovascular, Respiratory, Psychiatric, Allergic/Imm, Heme/Lymph   Last edited by Zenovia Jordan, LPN on 06/07/3531  9:92 AM. (History)        ALLERGIES Allergies  Allergen Reactions  . Omnicef [Cefdinir] Swelling    PAST MEDICAL HISTORY Past Medical History:  Diagnosis Date  . Allergy   . Gestational diabetes   . Heartburn in pregnancy    Past Surgical History:  Procedure Laterality Date  . CESAREAN SECTION    . CESAREAN SECTION N/A 11/30/2012   Procedure: CESAREAN SECTION;  Surgeon: Lahoma Crocker, MD;  Location: [facility];  Service: Obstetrics;  Laterality: N/A;  . STAPEDECTOMY Left 09/06/2013   Procedure: LEFT STAPEDECTOMY;  Surgeon: Izora Gala, MD;  Location: [facility];  Service: ENT;  Laterality: Left;    FAMILY HISTORY Family History  Problem Relation Age of Onset  . Anesthesia problems Neg Hx   . Hypotension Neg Hx   . Malignant hyperthermia Neg Hx   . Pseudochol deficiency Neg Hx     SOCIAL HISTORY Social History   Tobacco Use  . Smoking status: Never Smoker  . Smokeless tobacco: Never Used  Substance Use Topics  . Alcohol use: No  . Drug use: No         OPHTHALMIC EXAM:  Base Eye Exam    Visual Acuity (Snellen - Linear)      Right Left   Dist cc 20/20 -1 20/20 -1   Correction:  Glasses       Tonometry (Tonopen, 8:58 AM)      Right Left   Pressure 13 11  Pupils      Dark Light Shape React APD   Right 4 3 Round Slow None   Left 4 3 Round Slow None       Visual Fields (Counting fingers)      Left Right    Full Full       Extraocular Movement      Right Left    Full, Ortho Full, Ortho       Neuro/Psych    Oriented x3:  Yes   Mood/Affect:  Normal       Dilation    Both eyes:  1.0% Mydriacyl, 2.5% Phenylephrine @ 8:58 AM        Slit Lamp and Fundus Exam    Slit Lamp Exam      Right Left   Lids/Lashes Normal Normal   Conjunctiva/Sclera Mild Melanosis, Nasal Pinguecula Nasal Pinguecula, pigmented pinguecula temproally, mild Melanosis   Cornea Inferior 2+ Punctate epithelial erosions Inferior 2+ Punctate epithelial erosions   Anterior  Chamber Deep and quiet Deep and quiet   Iris Round and dilated, No NVI Round and dilated, No NVI   Lens 1+ Nuclear sclerosis, 2+ Cortical cataract 1+ Nuclear sclerosis, 2+ Cortical cataract   Vitreous Normal Normal       Fundus Exam      Right Left   Disc Normal, mild temporal peripapillary pigmentation Normal   C/D Ratio 0.4 0.4   Macula Good foveal reflex, Retinal pigment epithelial mottling, No heme or edema Good foveal reflex, Retinal pigment epithelial mottling, No heme or edema   Vessels Normal Normal   Periphery Attached, Inferior Lattice degeneration, atrophic hole at 0600, round operculated hole at 0730 - no SRF -- good early laser changes, not fully mature, peripheral cystoid degeneration Attached, Inferior Lattice degeneration, peripheral cystoid degeneration        Refraction    Wearing Rx      Sphere Cylinder Axis   Right -1.25 +0.75 010   Left -1.00 +0.50 165          IMAGING AND PROCEDURES  Imaging and Procedures for 01/01/18           ASSESSMENT/PLAN:    ICD-10-CM   1. 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 Lattice degeneration of both retinas H35.413   3. Diabetes mellitus type 2 without retinopathy ([place]) E11.9   4. Retinal edema H35.81   5. Combined forms of age-related cataract of both eyes H25.813     1,2. Lattice degeneration w/ atrophic holes, OD - atrophic hole at 0600, round operculated hole at 0730 -- no SRF, asymptomatic  - S/P laser retinopexy OD (03.21.19) -- good early laser changes, not fully mature - f/u in 4-6 wks, sooner prn  3. Diabetes mellitus, type 2 without retinopathy - The incidence, risk factors for progression, natural history and treatment options for diabetic retinopathy  were discussed with patient.   - The need for close monitoring of blood glucose, blood pressure, and serum lipids, avoiding cigarette or any type of tobacco, and the need for long term follow up was also discussed with patient. - f/u in 1 year, sooner  prn  4. No retinal edema on exam or OCT  5. Combined form age-related cataract OU-  - The symptoms of cataract, surgical options, and treatments and risks were discussed with patient. - discussed diagnosis and progression - not yet visually significant - monitor for now   Ophthalmic Meds Ordered this visit:  No orders of the defined types were placed  in this encounter.      Return in about 5 weeks (around 02/05/2018) for s/p laser retinopexy OD.  There are no Patient Instructions on file for this visit.   Explained the diagnoses, plan, and follow up with the patient and they expressed understanding.  Patient expressed understanding of the importance of proper follow up care.   This document serves as a record of services personally performed by Gardiner Sleeper, MD, PhD. It was created on their behalf by Catha Brow, [place], a certified ophthalmic assistant. The creation of this record is the provider's dictation and/or activities during the visit.  Electronically signed by: Catha Brow, COA  01/01/18 10:17 AM    Gardiner Sleeper, M.D., Ph.D. Diseases & Surgery of the Retina and [place] 01/01/18   I have reviewed the above documentation for accuracy and completeness, and I agree with the above. Gardiner Sleeper, M.D., Ph.D. 01/01/18 10:17 AM     Abbreviations: M myopia (nearsighted); A astigmatism; H hyperopia (farsighted); P presbyopia; Mrx spectacle prescription;  CTL contact lenses; OD right eye; OS left eye; OU both eyes  XT exotropia; ET esotropia; PEK punctate epithelial keratitis; PEE punctate epithelial erosions; DES dry eye syndrome; MGD meibomian gland dysfunction; ATs artificial tears; PFAT's preservative free artificial tears; [place] nuclear sclerotic cataract; PSC posterior subcapsular cataract; ERM epi-retinal membrane; PVD posterior vitreous detachment; RD retinal detachment; DM diabetes mellitus; DR diabetic retinopathy; NPDR  non-proliferative diabetic retinopathy; PDR proliferative diabetic retinopathy; CSME clinically significant macular edema; DME diabetic macular edema; dbh dot blot hemorrhages; CWS cotton wool spot; POAG primary open angle glaucoma; C/D cup-to-disc ratio; HVF humphrey visual field; GVF goldmann visual field; OCT optical coherence tomography; IOP intraocular pressure; BRVO Branch retinal vein occlusion; CRVO central retinal vein occlusion; CRAO central retinal artery occlusion; BRAO branch retinal artery occlusion; RT retinal tear; SB scleral buckle; PPV pars plana vitrectomy; VH Vitreous hemorrhage; PRP panretinal laser photocoagulation; IVK intravitreal kenalog; VMT vitreomacular traction; MH Macular hole;  NVD neovascularization of the disc; NVE neovascularization elsewhere; AREDS age related eye disease study; ARMD age related macular degeneration; POAG primary open angle glaucoma; EBMD epithelial/anterior basement membrane dystrophy; ACIOL anterior chamber intraocular lens; IOL intraocular lens; PCIOL posterior chamber intraocular lens; Phaco/IOL phacoemulsification with intraocular lens placement; [place] photorefractive keratectomy; LASIK laser assisted in situ keratomileusis; HTN hypertension; DM diabetes mellitus; COPD chronic obstructive pulmonary disease

## 2018-01-01 ENCOUNTER — Encounter (INDEPENDENT_AMBULATORY_CARE_PROVIDER_SITE_OTHER): Payer: Self-pay | Admitting: Ophthalmology

## 2018-01-01 ENCOUNTER — Ambulatory Visit (INDEPENDENT_AMBULATORY_CARE_PROVIDER_SITE_OTHER): Payer: Medicaid Other | Admitting: Ophthalmology

## 2018-01-01 DIAGNOSIS — H35413 Lattice degeneration of retina, bilateral: Secondary | ICD-10-CM

## 2018-01-01 DIAGNOSIS — H25813 Combined forms of age-related cataract, bilateral: Secondary | ICD-10-CM

## 2018-01-01 DIAGNOSIS — H3581 Retinal edema: Secondary | ICD-10-CM

## 2018-01-01 DIAGNOSIS — E119 Type 2 diabetes mellitus without complications: Secondary | ICD-10-CM

## 2018-01-01 DIAGNOSIS — H33321 Round hole, right eye: Secondary | ICD-10-CM

## 2018-02-11 NOTE — Progress Notes (Signed)
[facility] Note  02/13/2018     CHIEF COMPLAINT Patient presents for No chief complaint on file.   HISTORY OF PRESENT ILLNESS: Patty Blair is a 47 y.o. female who presents to the clinic today for:   Pt states she tolerated laser well;   Referring physician: Lahoma Crocker, MD [place], [place] [address]  HISTORICAL INFORMATION:   Selected notes from the MEDICAL RECORD NUMBER Referred by Dr. Zenia Resides for concern of retinal hole LEE-  Ocular Hx-  PMH-     CURRENT MEDICATIONS: No current outpatient medications on file. (Ophthalmic Drugs)   No current facility-administered medications for this visit.  (Ophthalmic Drugs)   Current Outpatient Medications (Other)  Medication Sig  . albuterol (PROVENTIL HFA;VENTOLIN HFA) 108 (90 Base) MCG/ACT inhaler Inhale 2 puffs into the lungs every 4 (four) hours as needed for wheezing or shortness of breath (cough, shortness of breath or wheezing.). (Patient not taking: Reported on 01/01/2018)   No current facility-administered medications for this visit.  (Other)      REVIEW OF SYSTEMS:    ALLERGIES Allergies  Allergen Reactions  . Omnicef [Cefdinir] Swelling    PAST MEDICAL HISTORY Past Medical History:  Diagnosis Date  . Allergy   . Gestational diabetes   . Heartburn in pregnancy    Past Surgical History:  Procedure Laterality Date  . CESAREAN SECTION    . CESAREAN SECTION N/A 11/30/2012   Procedure: CESAREAN SECTION;  Surgeon: Lahoma Crocker, MD;  Location: [facility];  Service: Obstetrics;  Laterality: N/A;  . STAPEDECTOMY Left 09/06/2013   Procedure: LEFT STAPEDECTOMY;  Surgeon: Izora Gala, MD;  Location: [facility];  Service: ENT;  Laterality: Left;    FAMILY HISTORY Family History  Problem Relation Age of Onset  . Anesthesia problems Neg Hx   . Hypotension Neg Hx   . Malignant hyperthermia Neg Hx   . Pseudochol deficiency Neg Hx     SOCIAL  HISTORY Social History   Tobacco Use  . Smoking status: Never Smoker  . Smokeless tobacco: Never Used  Substance Use Topics  . Alcohol use: No  . Drug use: No         OPHTHALMIC EXAM:   Not recorded      IMAGING AND PROCEDURES  Imaging and Procedures for 01/01/18           ASSESSMENT/PLAN:    ICD-10-CM   1. 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 Lattice degeneration of both retinas H35.413   3. Diabetes mellitus type 2 without retinopathy ([place]) E11.9   4. Retinal edema H35.81 OCT, Retina - OU - Both Eyes  5. Combined forms of age-related cataract of both eyes H25.813     1,2. Lattice degeneration w/ atrophic holes, OD - atrophic hole at 0600, round operculated hole at 0730 -- no SRF, asymptomatic  - S/P laser retinopexy OD (03.21.19) -- good early laser changes, not fully mature - f/u in 4-6 wks, sooner prn  3. Diabetes mellitus, type 2 without retinopathy - The incidence, risk factors for progression, natural history and treatment options for diabetic retinopathy  were discussed with patient.   - The need for close monitoring of blood glucose, blood pressure, and serum lipids, avoiding cigarette or any type of tobacco, and the need for long term follow up was also discussed with patient. - f/u in 1 year, or sooner prn  4. No retinal edema on exam or OCT  5.  Combined form age-related cataract OU-  - The symptoms of cataract, surgical options, and treatments and risks were discussed with patient. - discussed diagnosis and progression - not yet visually significant - monitor for now   Ophthalmic Meds Ordered this visit:  No orders of the defined types were placed in this encounter.      No follow-ups on file.  There are no Patient Instructions on file for this visit.   Explained the diagnoses, plan, and follow up with the patient and they expressed understanding.  Patient expressed understanding of the importance of proper follow up care.   This document  serves as a record of services personally performed by Gardiner Sleeper, MD, PhD. It was created on their behalf by Catha Brow, [place], a certified ophthalmic assistant. The creation of this record is the provider's dictation and/or activities during the visit.  Electronically signed by: Catha Brow, COA  05.15.19 4:40 PM    Gardiner Sleeper, M.D., Ph.D. Diseases & Surgery of the Retina and Vitreous [facility] 02/11/18     Abbreviations: M myopia (nearsighted); A astigmatism; H hyperopia (farsighted); P presbyopia; Mrx spectacle prescription;  CTL contact lenses; OD right eye; OS left eye; OU both eyes  XT exotropia; ET esotropia; PEK punctate epithelial keratitis; PEE punctate epithelial erosions; DES dry eye syndrome; MGD meibomian gland dysfunction; ATs artificial tears; PFAT's preservative free artificial tears; [place] nuclear sclerotic cataract; PSC posterior subcapsular cataract; ERM epi-retinal membrane; PVD posterior vitreous detachment; RD retinal detachment; DM diabetes mellitus; DR diabetic retinopathy; NPDR non-proliferative diabetic retinopathy; PDR proliferative diabetic retinopathy; CSME clinically significant macular edema; DME diabetic macular edema; dbh dot blot hemorrhages; CWS cotton wool spot; POAG primary open angle glaucoma; C/D cup-to-disc ratio; HVF humphrey visual field; GVF goldmann visual field; OCT optical coherence tomography; IOP intraocular pressure; BRVO Branch retinal vein occlusion; CRVO central retinal vein occlusion; CRAO central retinal artery occlusion; BRAO branch retinal artery occlusion; RT retinal tear; SB scleral buckle; PPV pars plana vitrectomy; VH Vitreous hemorrhage; PRP panretinal laser photocoagulation; IVK intravitreal kenalog; VMT vitreomacular traction; MH Macular hole;  NVD neovascularization of the disc; NVE neovascularization elsewhere; AREDS age related eye disease study; ARMD age related macular degeneration; POAG  primary open angle glaucoma; EBMD epithelial/anterior basement membrane dystrophy; ACIOL anterior chamber intraocular lens; IOL intraocular lens; PCIOL posterior chamber intraocular lens; Phaco/IOL phacoemulsification with intraocular lens placement; [place] photorefractive keratectomy; LASIK laser assisted in situ keratomileusis; HTN hypertension; DM diabetes mellitus; COPD chronic obstructive pulmonary disease

## 2018-02-13 ENCOUNTER — Encounter (INDEPENDENT_AMBULATORY_CARE_PROVIDER_SITE_OTHER): Payer: Medicaid Other | Admitting: Ophthalmology

## 2018-02-13 NOTE — Progress Notes (Signed)
This encounter was created in error - please disregard.

## 2018-03-15 IMAGING — CR DG CHEST 2V
2 series · 2 of 2 positions shown · non-contrast
Comparison: None.

CLINICAL DATA: Cough, runny nose, sore throat

EXAM:
CHEST  2 VIEW

[w chest pa]
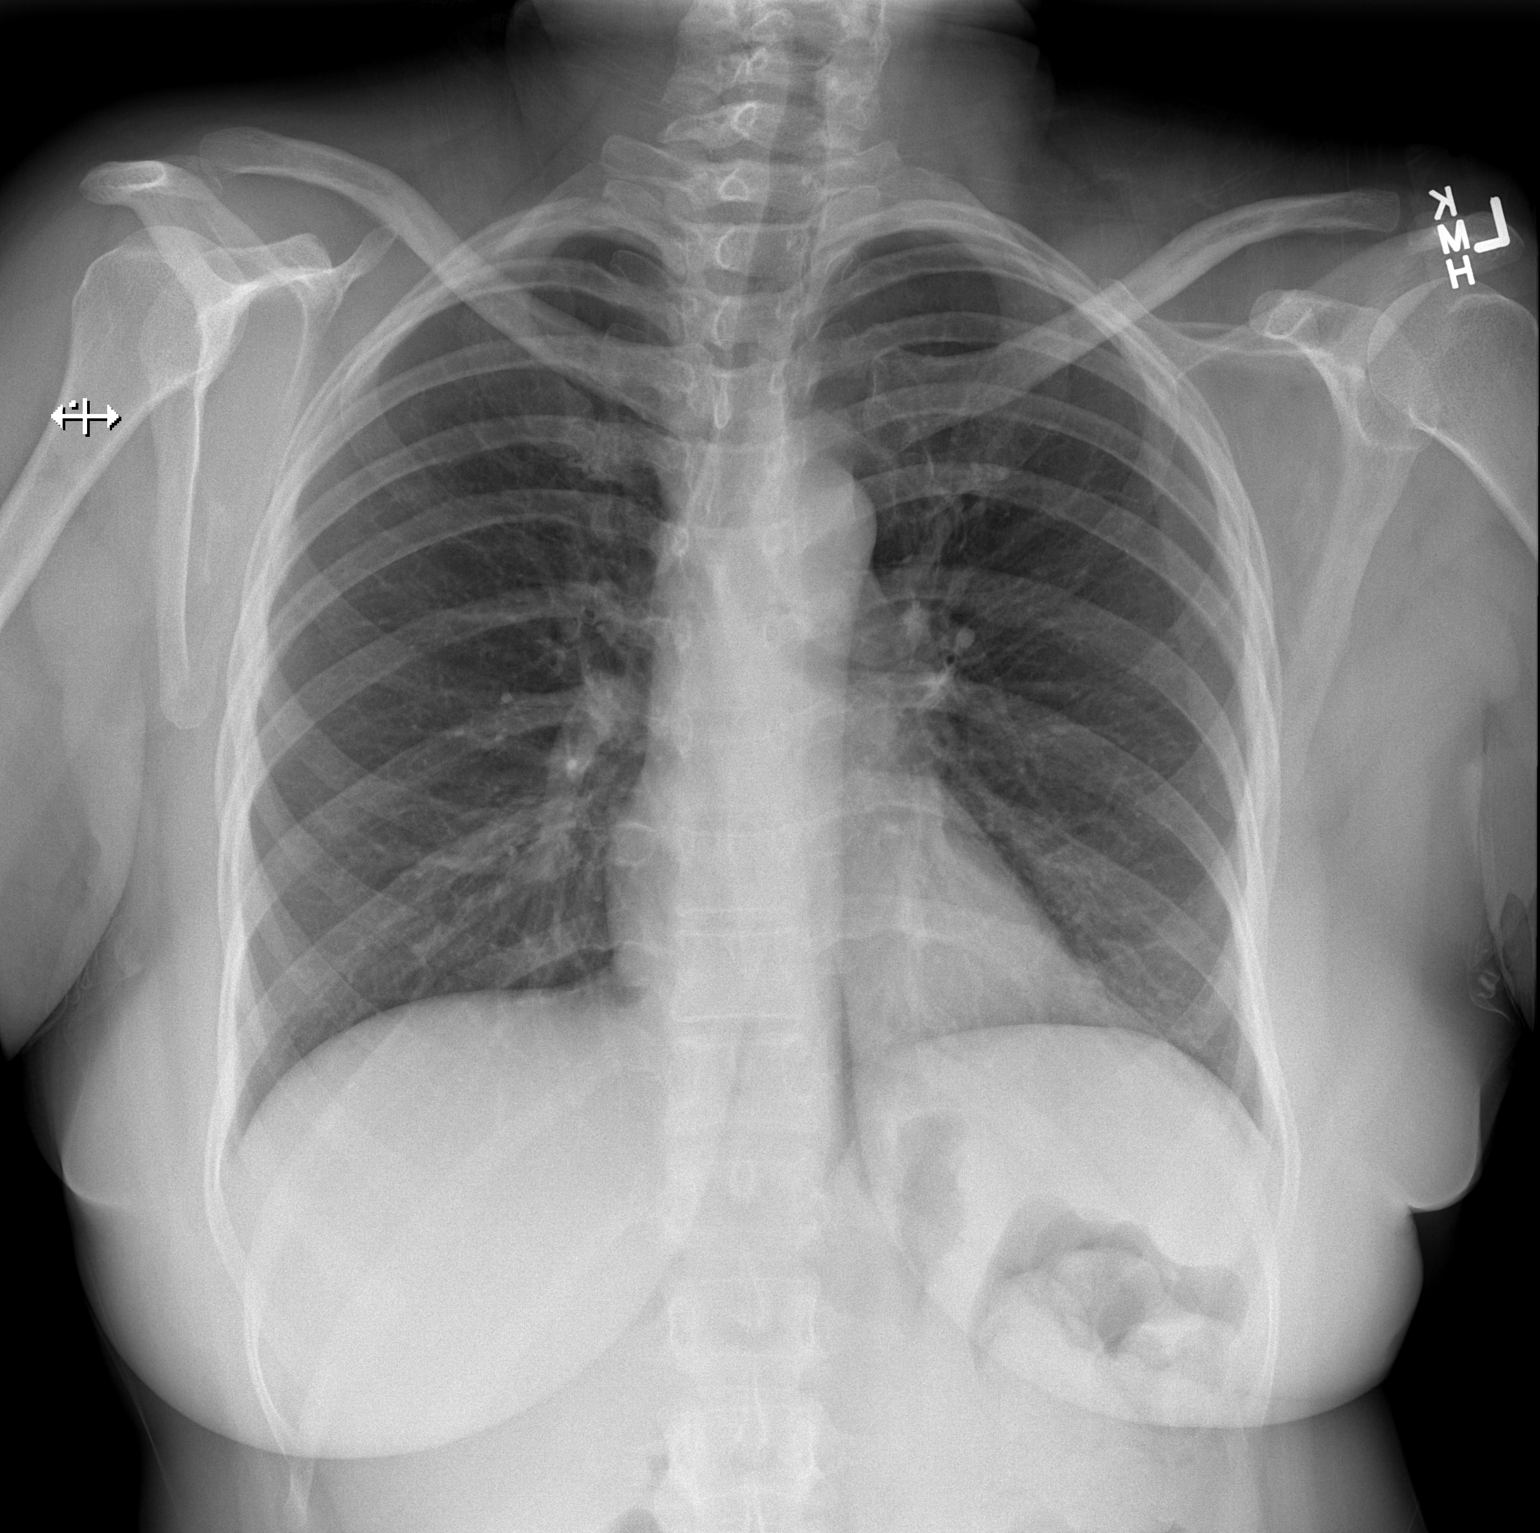

[w chest lat]
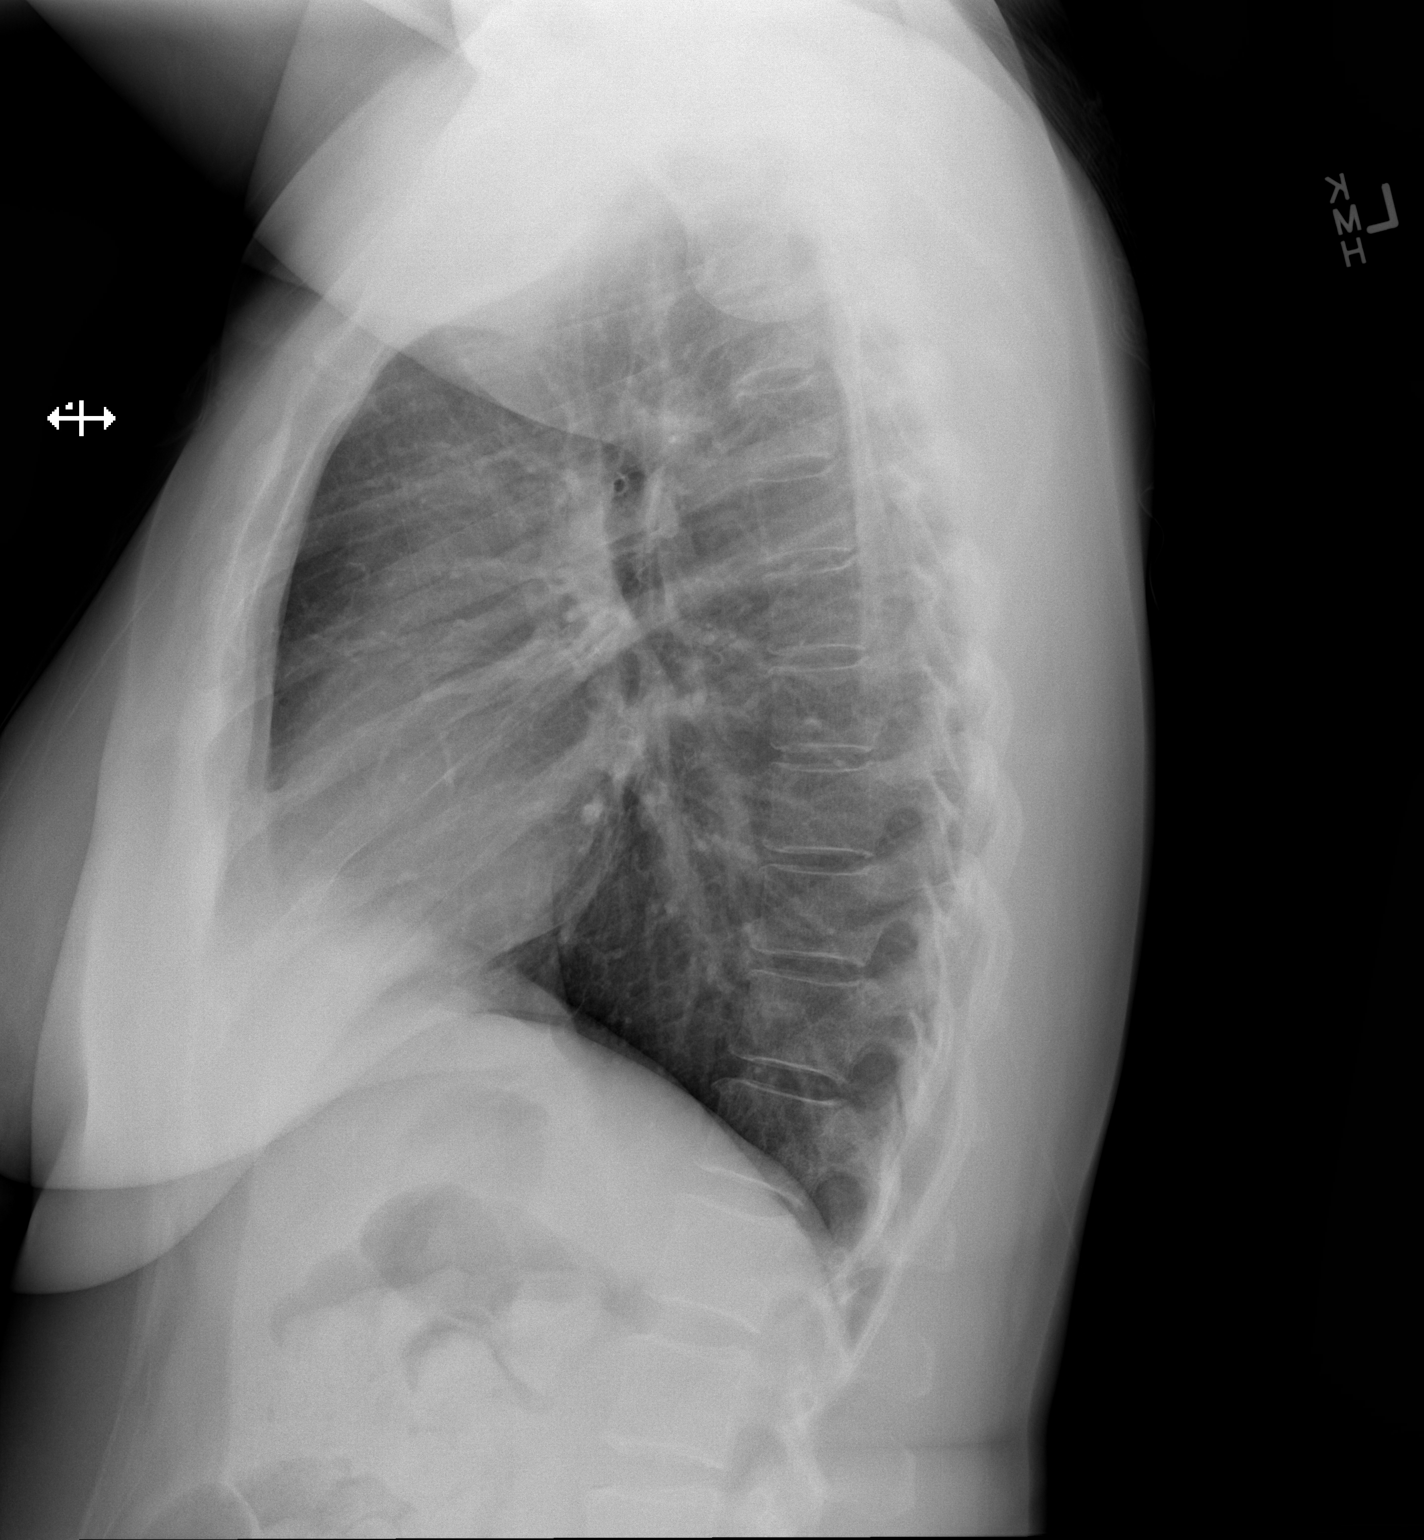

[2 of 2 positions shown; findings below may reference images not displayed]

FINDINGS: The heart size and mediastinal contours are within normal limits.
Both lungs are clear. The visualized skeletal structures are
unremarkable.
IMPRESSION: No active cardiopulmonary disease.

## 2019-10-13 ENCOUNTER — Ambulatory Visit: Payer: Medicaid Other | Attending: Internal Medicine

## 2019-10-13 DIAGNOSIS — Z20822 Contact with and (suspected) exposure to covid-19: Secondary | ICD-10-CM

## 2019-10-14 LAB — NOVEL CORONAVIRUS, NAA: SARS-CoV-2, NAA: NOT DETECTED

## 2021-01-20 LAB — EXTERNAL GENERIC LAB PROCEDURE: COLOGUARD: NEGATIVE

## 2021-01-20 LAB — COLOGUARD: COLOGUARD: NEGATIVE

## 2021-12-25 ENCOUNTER — Ambulatory Visit: Payer: Medicaid Other | Attending: Audiologist | Admitting: Audiologist

## 2021-12-25 ENCOUNTER — Other Ambulatory Visit: Payer: Self-pay

## 2021-12-25 DIAGNOSIS — H900 Conductive hearing loss, bilateral: Secondary | ICD-10-CM | POA: Insufficient documentation

## 2021-12-25 DIAGNOSIS — H8093 Unspecified otosclerosis, bilateral: Secondary | ICD-10-CM | POA: Insufficient documentation

## 2021-12-25 NOTE — Procedures (Signed)
?[facility] ?[address] ?[place], [place]  [address] ?[phone] ?  ?AUDIOLOGICAL  EVALUATION ? ?NAME: Patty Blair     ?DOB:   30-Dec-1970      ?MRN: [id number]                                                                                     ?DATE: 12/25/2021     ?REFERENT: Lahoma Crocker, MD ?STATUS: Outpatient ?DIAGNOSIS: Otosclerosis, Conductive Hearing Loss Bilaterally  ? ? ?History: ?Patty Blair was seen for an audiological evaluation.  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 Constance Holster. She showed [place] her MRI medical cards for each ear. Patty Blair denies difficulty hearing but wants to have her hearing checked since its been a few years. No pain or pressure reported in either ear. Tinnitus present before her surgery in both ears but is now gone. Patty Blair denies family history of hearing loss. Patty Blair says her right ear has always been the worse ear, she hears pretty well from the left ear. No other relevant case history reported.  ? ? ?Evaluation:  ?Otoscopy showed a clear view of the tympanic membrane in the left ear, right ear occluded with cerumen no TM visible   ?Tympanometry results were consistent with normal middle ear function, bilaterally   ?Audiometric testing was completed using conventional audiometry with supraural and insert transducer. Speech Recognition Thresholds were consistent with pure tone averages. Word Recognition was good in each ear an elevated level of 40dB SL with masking. Pure tone thresholds in the left ear normal sloping after 2k Hz to a mild conductive hearing loss, right ear shows moderate conductive hearing loss 250-8k Hz. ? ?Results:  ?The test results were reviewed with Patty Blair. She has mild loss in her left ear and moderate loss in the right. Her middle ears are functioning well. If she wants a hearing aid for the right ear she will need to go back to [facility]. Otherwise, we just  recommend annual hearing tests to monitor hearing loss and middle ear function. She reported understanding. She was also given a handout on Debrox and told to not use qtips to clean wax from right ear.  ? ?Recommendations: ?Recommend annual audiology monitoring due to history of middle ear implant.  ?Debrox Earwax Removal Drops are a safe and inexpensive in-home solution for wax removal. Debrox Earwax Removal Kit includes a soft rubber bulb syringe to rinse your ear after using Debrox Earwax Removal Drops. Excessive earwax build-up can lead to ear discomfort and reduced hearing, which can affect your day-to-day life. The kit can be purchased over the counter at [place] [facility], [place], [facility], and most other pharmacies.  ? ?How to use the Debrox Earwax Removal Drops Kit:  ?tilt head sideways. ?place 5 to 10 drops into ear. ?tip of applicator should not enter ear canal. ?keep drops in ear for several minutes by keeping head tilted or placing cotton in the ear. ?use twice daily for up to four days  ?gently flush ear with water, using soft rubber bulb syringe after final treatment (on 4th day)   ? ?  ?Alfonse Alpers  ?Audiologist,  Au.D., CCC-A ?12/25/2021  2:38 PM ? ?Cc: Lahoma Crocker, MD  ?

## 2021-12-30 ENCOUNTER — Ambulatory Visit (HOSPITAL_COMMUNITY)
Admission: EM | Admit: 2021-12-30 | Discharge: 2021-12-30 | Disposition: A | Discharge status: Home / Self Care | Payer: Medicaid Other

## 2021-12-30 ENCOUNTER — Encounter (HOSPITAL_COMMUNITY): Payer: Self-pay | Admitting: Urgent Care

## 2021-12-30 ENCOUNTER — Ambulatory Visit (INDEPENDENT_AMBULATORY_CARE_PROVIDER_SITE_OTHER): Payer: Medicaid Other

## 2021-12-30 ENCOUNTER — Other Ambulatory Visit: Payer: Self-pay

## 2021-12-30 DIAGNOSIS — S83412A Sprain of medial collateral ligament of left knee, initial encounter: Secondary | ICD-10-CM | POA: Diagnosis not present

## 2021-12-30 DIAGNOSIS — W19XXXA Unspecified fall, initial encounter: Secondary | ICD-10-CM | POA: Diagnosis not present

## 2021-12-30 DIAGNOSIS — S90112A Contusion of left great toe without damage to nail, initial encounter: Secondary | ICD-10-CM | POA: Diagnosis not present

## 2021-12-30 DIAGNOSIS — M79675 Pain in left toe(s): Secondary | ICD-10-CM | POA: Diagnosis not present

## 2021-12-30 DIAGNOSIS — M25562 Pain in left knee: Secondary | ICD-10-CM

## 2021-12-30 MED ORDER — NAPROXEN 375 MG PO TABS: 375.0000 mg | ORAL_TABLET | Freq: Two times a day (BID) | ORAL | 0 refills | Status: AC

## 2021-12-30 NOTE — Discharge Instructions (Addendum)
Your x-rays show no signs of a broken bone or a fracture. ?Your knee pain is likely due to an MCL strain. ?Please wear the hinged knee brace as directed. ?Take the anti-inflammatory medication twice daily as needed with food, up to 7 days.  Do not take additional over-the-counter anti-inflammatory medication.  Over-the-counter Tylenol is acceptable. ?You have a contusion to your toe.  There is no sign of fracture. ?Please follow-up with your PCP should symptoms persist ?

## 2021-12-30 NOTE — ED Triage Notes (Signed)
Pt reports that she fell yesterday landing on left leg causing pain in left knee and left toes.  ?

## 2021-12-30 NOTE — ED Provider Notes (Signed)
?[facility] ? ? ? ?CSN: [id number] ?Arrival date & time: 12/30/21  1234 ? ? ?  ? ?History   ?Chief Complaint ?Chief Complaint  ?Patient presents with  ? Fall  ? Knee Pain  ? Foot Pain  ? ? ?HPI ?Patty Blair is a 51 y.o. female.  ? ?Pleasant 51 year old female presents today due to concerns of a fall.  She states she was waiting for the bus yesterday, and fell in the grass.  She landed on her left knee and believes she twisted her toe.  She states that immediately upon falling, she had left knee pain.  She reports the pain today is primarily to the medial aspect of the knee into the great toe IP joint.  She did not take any over-the-counter medications last night or this morning.  She did ice the knee.  She states that any movement of the knee causes pain.  She denies any prior orthopedic issues and denies additional complaints today. ? ? ?Fall ? ?Knee Pain ?Foot Pain ? ? ?Past Medical History:  ?Diagnosis Date  ? Allergy   ? Gestational diabetes   ? Heartburn in pregnancy   ? ? ?Patient Active Problem List  ? Diagnosis Date Noted  ? 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 Routine postpartum follow-up 01/25/2013  ? 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 Previous cesarean section 11/28/2012  ? ? ?Past Surgical History:  ?Procedure Laterality Date  ? CESAREAN SECTION    ? CESAREAN SECTION N/A 11/30/2012  ? Procedure: CESAREAN SECTION;  Surgeon: Lahoma Crocker, MD;  Location: [facility];  Service: Obstetrics;  Laterality: N/A;  ? STAPEDECTOMY Left 09/06/2013  ? Procedure: LEFT STAPEDECTOMY;  Surgeon: Izora Gala, MD;  Location: [facility];  Service: ENT;  Laterality: Left;  ? ? ?OB History   ? ? Gravida  ?5  ? Para  ?4  ? Term  ?4  ? Preterm  ?0  ? AB  ?0  ? Living  ?4  ?  ? ? SAB  ?0  ? IAB  ?0  ? Ectopic  ?0  ? Multiple  ?0  ? Live Births  ?1  ?   ?  ?  ? ? ? ?Home Medications   ? ?Prior to Admission medications   ?Medication Sig Start  Date End Date Taking? Authorizing Provider  ?naproxen (NAPROSYN) 375 MG tablet Take 1 tablet (375 mg total) by mouth 2 (two) times daily with a meal for 7 days. 12/30/21 01/06/22 Yes Nyima Vanacker L, PA  ?metFORMIN (GLUCOPHAGE-XR) 500 MG 24 hr tablet Take 500 mg by mouth 2 (two) times daily. 11/30/21   [provider]  ? ? ?Family History ?Family History  ?Problem Relation Age of Onset  ? Anesthesia problems Neg Hx   ? Hypotension Neg Hx   ? Malignant hyperthermia Neg Hx   ? Pseudochol deficiency Neg Hx   ? ? ?Social History ?Social History  ? ?Tobacco Use  ? Smoking status: Never  ? Smokeless tobacco: Never  ?Vaping Use  ? Vaping Use: Never used  ?Substance Use Topics  ? Alcohol use: No  ? Drug use: No  ? ? ? ?Allergies   ?Omnicef [cefdinir] ? ? ?Review of Systems ?Review of Systems  ?Musculoskeletal:  Positive for arthralgias (L knee; L great toe).  ?All other systems reviewed and are negative. ? ? ?Physical Exam ?Triage Vital Signs ?ED Triage Vitals [12/30/21 1352]  ?Enc Vitals Group  ?  BP 118/75  ?   Pulse Rate 69  ?   Resp 18  ?   Temp 98.5 ?F (36.9 ?C)  ?   Temp Source Oral  ?   SpO2 100 %  ?   Weight   ?   Height   ?   Head Circumference   ?   Peak Flow   ?   Pain Score 8  ?   Pain Loc   ?   Pain Edu?   ?   Excl. in [place]?   ? ?No data found. ? ?Updated Vital Signs ?BP 118/75 (BP Location: Right Arm)   Pulse 69   Temp 98.5 ?F (36.9 ?C) (Oral)   Resp 18   LMP 12/16/2021   SpO2 100%  ? ?Visual Acuity ?Right Eye Distance:   ?Left Eye Distance:   ?Bilateral Distance:   ? ?Right Eye Near:   ?Left Eye Near:    ?Bilateral Near:    ? ?Physical Exam ?Vitals and nursing note reviewed. Exam conducted with a chaperone present.  ?Constitutional:   ?   General: She is not in acute distress. ?   Appearance: Normal appearance. She is normal weight. She is not ill-appearing, toxic-appearing or diaphoretic.  ?HENT:  ?   Head: Normocephalic and atraumatic.  ?Musculoskeletal:  ?   Right knee: Normal.  ?   Left knee:  Swelling (mild medially) and bony tenderness (medial femoral condyle) present. No deformity, effusion, erythema, ecchymosis, lacerations or crepitus. Normal range of motion. Tenderness present over the medial joint line and MCL. No lateral joint line, LCL, ACL, PCL or patellar tendon tenderness. MCL laxity (minimal) present. No LCL laxity, ACL laxity or PCL laxity.Normal meniscus and normal patellar mobility.  ?   Instability Tests: Anterior drawer test negative. Posterior drawer test negative. Anterior Lachman test negative. Medial McMurray test negative and lateral McMurray test negative.  ?   Right lower leg: Normal. No swelling, deformity, lacerations, tenderness or bony tenderness. No edema.  ?   Left lower leg: Normal. No swelling, deformity, tenderness or bony tenderness. No edema.  ?   Right ankle: Normal. No swelling, deformity or ecchymosis. No tenderness. Normal range of motion.  ?   Right Achilles Tendon: Normal. No tenderness.  ?   Left ankle: Normal. No swelling, deformity or ecchymosis. No tenderness. Normal range of motion.  ?   Left Achilles Tendon: Normal. No tenderness.  ?   Right foot: Normal range of motion and normal capillary refill. No swelling, deformity, laceration or bony tenderness. Normal pulse.  ?   Left foot: Normal range of motion and normal capillary refill. Bony tenderness (to L great toe IP joint) present. No swelling, deformity or laceration. Normal pulse.  ?     Legs: ? ?   Comments: FROM L knee but pain upon initiation and at full extension  ?Skin: ?   General: Skin is warm.  ?   Capillary Refill: Capillary refill takes less than 2 seconds.  ?   Findings: No erythema or rash.  ?Neurological:  ?   General: No focal deficit present.  ?   Mental Status: She is alert and oriented to person, place, and time.  ?   Sensory: No sensory deficit.  ?   Motor: No weakness.  ? ? ? ?UC Treatments / Results  ?Labs ?(all labs ordered are listed, but only abnormal results are displayed) ?Labs  Reviewed - No data to display ? ?EKG ? ? ?Radiology ?DG Knee  Complete 4 Views Left ? ?Result Date: 12/30/2021 ?CLINICAL DATA:  Fall, left knee pain EXAM: LEFT KNEE - COMPLETE 4+ VIEW COMPARISON:  None. FINDINGS: No evidence of fracture, dislocation, or joint effusion. No evidence of arthropathy or other focal bone abnormality. Soft tissues are unremarkable. IMPRESSION: Negative. Electronically Signed   By: Davina Poke D.O.   On: 12/30/2021 14:31  ? ?DG Toe Great Left ? ?Result Date: 12/30/2021 ?CLINICAL DATA:  Fall, left great toe pain EXAM: LEFT GREAT TOE COMPARISON:  None. FINDINGS: There is no evidence of fracture or dislocation. There is no evidence of arthropathy or other focal bone abnormality. Soft tissues are unremarkable. IMPRESSION: Negative. Electronically Signed   By: Davina Poke D.O.   On: 12/30/2021 14:32   ? ?Procedures ?Procedures (including critical care time) ? ?Medications Ordered in UC ?Medications - No data to display ? ?Initial Impression / Assessment and Plan / UC Course  ?I have reviewed the triage vital signs and the nursing notes. ? ?Pertinent labs & imaging results that were available during my care of the patient were reviewed by me and considered in my medical decision making (see chart for details). ? ?  ? ?MCL sprain L knee - hinged knee brace as pt c/o instability. Xray negative for acute fx or dislocation. NSAIDs as needed, ice x 3 days. ?Contusion L great toe - negative xray. Overall benign exam. NSAID for knee will cover toe as well. Limit excessive use x 2-3 days. ?Fall - mechanical. No further workup needed. No head trauma ? ?Final Clinical Impressions(s) / UC Diagnoses  ? ?Final diagnoses:  ?Sprain of medial collateral ligament of left knee, initial encounter  ?Contusion of left great toe without damage to nail, initial encounter  ?Fall, initial encounter  ? ? ? ?Discharge Instructions   ? ?  ?Your x-rays show no signs of a broken bone or a fracture. ?Your knee pain is  likely due to an MCL strain. ?Please wear the hinged knee brace as directed. ?Take the anti-inflammatory medication twice daily as needed with food, up to 7 days.  Do not take additional over-the-counter anti-inflamma

## 2023-10-20 ENCOUNTER — Other Ambulatory Visit: Payer: Self-pay

## 2023-10-20 ENCOUNTER — Encounter (HOSPITAL_COMMUNITY): Payer: Self-pay

## 2023-10-20 ENCOUNTER — Emergency Department (HOSPITAL_COMMUNITY): Payer: Medicaid Other

## 2023-10-20 ENCOUNTER — Emergency Department (HOSPITAL_COMMUNITY)
Admission: EM | Admit: 2023-10-20 | Discharge: 2023-10-20 | Disposition: A | Discharge status: Home / Self Care | Payer: Medicaid Other | Attending: Emergency Medicine | Admitting: Emergency Medicine

## 2023-10-20 DIAGNOSIS — R072 Precordial pain: Secondary | ICD-10-CM | POA: Diagnosis present

## 2023-10-20 DIAGNOSIS — Z7984 Long term (current) use of oral hypoglycemic drugs: Secondary | ICD-10-CM | POA: Insufficient documentation

## 2023-10-20 DIAGNOSIS — R1013 Epigastric pain: Secondary | ICD-10-CM | POA: Insufficient documentation

## 2023-10-20 DIAGNOSIS — E119 Type 2 diabetes mellitus without complications: Secondary | ICD-10-CM | POA: Diagnosis not present

## 2023-10-20 DIAGNOSIS — R079 Chest pain, unspecified: Secondary | ICD-10-CM

## 2023-10-20 LAB — HCG, SERUM, QUALITATIVE: Preg, Serum: NEGATIVE

## 2023-10-20 LAB — COMPREHENSIVE METABOLIC PANEL
ALT: 15 U/L (ref 0–44)
AST: 18 U/L (ref 15–41)
Albumin: 3.9 g/dL (ref 3.5–5.0)
Alkaline Phosphatase: 69 U/L (ref 38–126)
Anion gap: 10 (ref 5–15)
BUN: 10 mg/dL (ref 6–20)
CO2: 24 mmol/L (ref 22–32)
Calcium: 9.2 mg/dL (ref 8.9–10.3)
Chloride: 104 mmol/L (ref 98–111)
Creatinine, Ser: 0.52 mg/dL (ref 0.44–1.00)
GFR, Estimated: >60 mL/min (ref >60)
Glucose, Bld: 118 mg/dL — ABNORMAL HIGH (ref 70–99)
Potassium: 3.6 mmol/L (ref 3.5–5.1)
Sodium: 138 mmol/L (ref 135–145)
Total Bilirubin: 0.6 mg/dL (ref 0.0–1.2)
Total Protein: 7.3 g/dL (ref 6.5–8.1)

## 2023-10-20 LAB — CBC
HCT: 38.5 % (ref 36.0–46.0)
Hemoglobin: 12.9 g/dL (ref 12.0–15.0)
MCH: 29.9 pg (ref 26.0–34.0)
MCHC: 33.5 g/dL (ref 30.0–36.0)
MCV: 89.3 fL (ref 80.0–100.0)
Platelets: 340 10*3/uL (ref 150–400)
RBC: 4.31 MIL/uL (ref 3.87–5.11)
RDW: 12.6 % (ref 11.5–15.5)
WBC: 3.7 10*3/uL — ABNORMAL LOW (ref 4.0–10.5)
nRBC: 0 % (ref 0.0–0.2)

## 2023-10-20 LAB — TROPONIN I (HIGH SENSITIVITY): Troponin I (High Sensitivity): <2 ng/L (ref <18)

## 2023-10-20 LAB — URINALYSIS, ROUTINE W REFLEX MICROSCOPIC
Bilirubin Urine: NEGATIVE
Glucose, UA: NEGATIVE mg/dL
Hgb urine dipstick: NEGATIVE
Ketones, ur: NEGATIVE mg/dL
Leukocytes,Ua: NEGATIVE
Nitrite: NEGATIVE
Protein, ur: NEGATIVE mg/dL
Specific Gravity, Urine: 1.009 (ref 1.005–1.030)
pH: 5 (ref 5.0–8.0)

## 2023-10-20 LAB — LIPASE, BLOOD: Lipase: 34 U/L (ref 11–51)

## 2023-10-20 MED ORDER — FAMOTIDINE 20 MG PO TABS: 20.0000 mg | ORAL_TABLET | Freq: Two times a day (BID) | ORAL | 0 refills | Status: DC

## 2023-10-20 MED ORDER — PANTOPRAZOLE SODIUM 20 MG PO TBEC: 20.0000 mg | DELAYED_RELEASE_TABLET | Freq: Every day | ORAL | 0 refills | Status: DC

## 2023-10-20 NOTE — Discharge Instructions (Addendum)
You were seen today for chest pain that started last night.  Due to you not having experienced this in the past and with your risk factors of diabetes and being 53 years old, I recommend a follow-up with cardiology.  I have put in a referral for you.  They should reach out to you within the next 48 hours.  If they have not contacted you, be sure to reach out to their office to have a visit scheduled.  I do not believe that there are any emergent processes happening at this time.  Your lab work and imaging were both very reassuring.  Your ECG was also very reassuring.  However if you begin having increased chest pain, weakness, fatigue, shortness of breath, leg swelling, confusion return to the ED for immediate reevaluation.  I have also prescribed famotidine and Protonix for you.  Protonix you are to take every day once a day.  This is the long-term medication that will help with GERD symptoms in the future.  I am also prescribing famotidine, this is to be taken whenever you are experiencing symptoms of the burning chest pain.  Follow-up with your PCP as they will be the ones to represcribe these medications if needed.  It was a pleasure seeing you in the ER.

## 2023-10-20 NOTE — ED Provider Notes (Signed)
[facility] EMERGENCY DEPARTMENT AT [facility] Provider Note   CSN: [id number] Arrival date & time: 10/20/23  1351     History  Chief Complaint  Patient presents with   Chest Pain   Abdominal Pain    Patty Blair is a 53 y.o. female.   Chest Pain Associated symptoms: abdominal pain   Abdominal Pain Associated symptoms: chest pain    Pt presents to the ED today due to 1 episode of burning, central sternal chest pain radiating to epigastric abdomen. States that she had 1 episode of this burning pain 1 week previous. However, last night's episode was more painful. Previous Hx of heartburn during pregnancy.   Previously took abx but only took 2 days for paronychia 3 weeks ago.  Endorses nausea.  Denies headache, vision changes, weakness, fatigue, numbness, tingling, shortness of breath, diarrhea, constipation, fever, dysuria, hematuria, back pain.     Home Medications Prior to Admission medications   Medication Sig Start Date End Date Taking? Authorizing Provider  famotidine (PEPCID) 20 MG tablet Take 1 tablet (20 mg total) by mouth 2 (two) times daily. 10/20/23  Yes Lunette Stands, PA-C  pantoprazole (PROTONIX) 20 MG tablet Take 1 tablet (20 mg total) by mouth daily. 10/20/23  Yes Lunette Stands, PA-C  metFORMIN (GLUCOPHAGE-XR) 500 MG 24 hr tablet Take 500 mg by mouth 2 (two) times daily. 11/30/21   [provider]      Allergies    Omnicef [cefdinir]    Review of Systems   Review of Systems  Cardiovascular:  Positive for chest pain.  Gastrointestinal:  Positive for abdominal pain.  All other systems reviewed and are negative.   Physical Exam Updated Vital Signs BP 134/78   Pulse 79   Temp 98.4 F (36.9 C)   Resp 18   Ht 5\' 4"  (1.626 m)   Wt 75.8 kg   LMP 12/16/2021   SpO2 100%   BMI 28.67 kg/m  Physical Exam Vitals and nursing note reviewed.  Constitutional:      General: She is not in acute distress.    Appearance: Normal  appearance. She is not ill-appearing.  HENT:     Head: Normocephalic and atraumatic.  Eyes:     Extraocular Movements: Extraocular movements intact.     Conjunctiva/sclera: Conjunctivae normal.  Cardiovascular:     Rate and Rhythm: Normal rate and regular rhythm.     Pulses: Normal pulses.     Heart sounds: Normal heart sounds. No murmur heard.    No friction rub. No gallop.  Pulmonary:     Effort: Pulmonary effort is normal. No respiratory distress.     Breath sounds: Normal breath sounds. No decreased breath sounds, wheezing, rhonchi or rales.  Chest:     Chest wall: No tenderness.  Abdominal:     General: Abdomen is flat.     Palpations: Abdomen is soft.     Tenderness: There is no abdominal tenderness.  Musculoskeletal:     Right lower leg: No tenderness. No edema.     Left lower leg: No edema.  Skin:    General: Skin is warm and dry.  Neurological:     General: No focal deficit present.     Mental Status: She is alert. Mental status is at baseline.  Psychiatric:        Mood and Affect: Mood normal.     ED Results / Procedures / Treatments   Labs (all labs ordered are listed, but  only abnormal results are displayed) Labs Reviewed  COMPREHENSIVE METABOLIC PANEL - Abnormal; Notable for the following components:      Result Value   Glucose, Bld 118 (*)    All other components within normal limits  CBC - Abnormal; Notable for the following components:   WBC 3.7 (*)    All other components within normal limits  URINALYSIS, ROUTINE W REFLEX MICROSCOPIC - Abnormal; Notable for the following components:   Color, Urine STRAW (*)    All other components within normal limits  LIPASE, BLOOD  HCG, SERUM, QUALITATIVE  TROPONIN I (HIGH SENSITIVITY)    EKG EKG Interpretation Date/Time:  Monday October 20 2023 14:19:56 EST Ventricular Rate:  75 PR Interval:  136 QRS Duration:  82 QT Interval:  370 QTC Calculation: 413 R Axis:   107  Text Interpretation: SInus rhythm,  possible lead arm reversal Confirmed by Alvester Chou [phone]) on 10/20/2023 3:22:34 PM  Radiology DG Chest 2 View Result Date: 10/20/2023 CLINICAL DATA:  Chest pain and intermittent shortness of breath since last night. EXAM: CHEST - 2 VIEW COMPARISON:  Chest x-ray dated October 31, 2016. FINDINGS: The heart size and mediastinal contours are within normal limits. Both lungs are clear. The visualized skeletal structures are unremarkable. IMPRESSION: No active cardiopulmonary disease. Electronically Signed   By: Obie Dredge M.D.   On: 10/20/2023 15:43    Procedures Procedures    Medications Ordered in ED Medications - No data to display  ED Course/ Medical Decision Making/ A&P            HEART Score: 2                    Medical Decision Making Amount and/or Complexity of Data Reviewed Labs: ordered. Radiology: ordered.   This patient is a 53 year old female who presents to the ED for concern of burning central sternal, epigastric abdominal pain.   Differential diagnoses prior to evaluation: The emergent differential diagnosis includes, but is not limited to, ACS, GERD, pneumonia, esophagitis, esophageal rupture, gastritis, pancreatitis. This is not an exhaustive differential.   Past Medical History / Co-morbidities / Social History: Heartburn in pregnancy, type 2 diabetes currently taking Ozempic.  Additional history: Chart reviewed. Pertinent results include: Previously treated with antibiotics for paronychia on 08/13/2023  Lab Tests/Imaging studies: I personally interpreted labs/imaging and the pertinent results include:   PE unremarkable CBC unremarkable UA unremarkable Lipase unremarkable Pregnancy test negative Troponin unremarkable Chest x-ray unremarkable I agree with the radiologist interpretation.  Cardiac monitoring: EKG obtained and interpreted by myself and attending physician which shows: NSR   Medications: I ordered medication including famotidine,  Protonix.  I have reviewed the patients home medicines and have made adjustments as needed.  ED Course:  Patient is a 59-year-old female presents to the ED today complaining of 1 episode of burning epigastric pain radiating to central sternum.  This episode lasted for 2 hours waking her up from sleep while lying down and.  Medical history of type 2 diabetes and heartburn during pregnancy.  Patient is currently asymptomatic.  States she has had 1 episode like this prior 1 week ago while laying down but was less painful than last night.  Pain is not worse on exertion.  Denies any shortness of breath, leg swelling, fatigue, numbness, tingling.  Denies smoking.  Physical exam was unremarkable.  Patient's labs were also reassuring with a negative initial troponin.  Due to patient experiencing these same pains over the course of  the last week, no need for repeat troponin.  Patient was then provided Protonix and famotidine to be picked up at the pharmacy.  Will treat her for GERD however will have her follow-up with cardiology for this chest pain which has not been seen by cardiology and with risk factors, may benefit from a stress test.  Heart score of 2.  Patient vitals have remained stable throughout her course of her stay here.  I believe this patient is safe to discharge this time.  Provided strict return to ER precautions.  Patient expressed agreement understanding of plan.   Disposition: After consideration of the diagnostic results and the patients response to treatment, I feel that patient benefit from discharge and treatment as above.   emergency department workup does not suggest an emergent condition requiring admission or immediate intervention beyond what has been performed at this time. The plan is: Famotidine, PPI for GERD symptoms, follow-up with cardiology for evaluation, follow-up with PCP for instrument of treatment. The patient is safe for discharge and has been instructed to return  immediately for worsening symptoms, change in symptoms or any other concerns.   Final Clinical Impression(s) / ED Diagnoses Final diagnoses:  Chest pain, unspecified type    Rx / DC Orders ED Discharge Orders          Ordered    famotidine (PEPCID) 20 MG tablet  2 times daily        10/20/23 1620    pantoprazole (PROTONIX) 20 MG tablet  Daily        10/20/23 1620    Ambulatory referral to Cardiology       Comments: If you have not heard from the Cardiology office within the next 72 hours please call [phone].   10/20/23 1621              Lunette Stands, PA-C 10/20/23 1638    Terald Sleeper, MD 10/20/23 [phone]

## 2023-10-20 NOTE — ED Triage Notes (Signed)
Pt came in via POV d/t central lower CP that started last night. States it feels like burning that radiates to the Lt side of her abd. Does endorse nausea, denies vomiting. A/Ox4, rates her pain 5/10.

## 2023-12-23 ENCOUNTER — Encounter: Payer: Self-pay | Admitting: *Deleted

## 2023-12-23 ENCOUNTER — Ambulatory Visit: Payer: Medicaid Other | Attending: Cardiology | Admitting: Cardiology

## 2023-12-23 ENCOUNTER — Encounter: Payer: Self-pay | Admitting: Cardiology

## 2023-12-23 VITALS — BP 122/68 | HR 72 | Resp 16 | Wt 168.0 lb

## 2023-12-23 DIAGNOSIS — E119 Type 2 diabetes mellitus without complications: Secondary | ICD-10-CM

## 2023-12-23 DIAGNOSIS — E782 Mixed hyperlipidemia: Secondary | ICD-10-CM | POA: Diagnosis not present

## 2023-12-23 DIAGNOSIS — R072 Precordial pain: Secondary | ICD-10-CM

## 2023-12-23 MED ORDER — EZETIMIBE 10 MG PO TABS: 10.0000 mg | ORAL_TABLET | Freq: Every day | ORAL | 0 refills | Status: AC

## 2023-12-23 NOTE — Patient Instructions (Signed)
 Medication Instructions:  Your physician has recommended you make the following change in your medication: Start Zetia 10 mg by mouth daily  *If you need a refill on your cardiac medications before your next appointment, please call your pharmacy*   Lab Work: none If you have labs (blood work) drawn today and your tests are completely normal, you will receive your results only by: MyChart Message (if you have MyChart) OR A paper copy in the mail If you have any lab test that is abnormal or we need to change your treatment, we will call you to review the results.   Testing/Procedures: Your physician has requested that you have en exercise stress myoview. For further information please visit [email or web address] Please follow instruction sheet, as given.    Follow-Up: At [facility], you and your health needs are our priority.  As part of our continuing mission to provide you with exceptional heart care, we have created designated Provider Care Teams.  These Care Teams include your primary Cardiologist (physician) and Advanced Practice Providers (APPs -  Physician Assistants and Nurse Practitioners) who all work together to provide you with the care you need, when you need it.  We recommend signing up for the patient portal called "MyChart".  Sign up information is provided on this After Visit Summary.  MyChart is used to connect with patients for Virtual Visits (Telemedicine).  Patients are able to view lab/test results, encounter notes, upcoming appointments, etc.  Non-urgent messages can be sent to your provider as well.   To learn more about what you can do with MyChart, go to [email or web address].    Your next appointment:   As needed  Provider:   Yates Decamp, MD     Other Instructions

## 2023-12-23 NOTE — Progress Notes (Signed)
 Cardiology Office Note:  .   Date:  12/23/2023  ID:  Theotis Burrow, DOB March 28, 1971, MRN [id number] PCP: Cyril Mourning, FNP  [facility] Providers Cardiologist:  Yates Decamp, MD   History of Present Illness: .   Patty Blair is a 53 y.o. Female patient with longstanding diabetes mellitus, hypercholesterolemia referred to me for evaluation of chest pain, patient seen in the emergency room on 10/20/2023.Marland Kitchen  Discussed the use of AI scribe software for clinical note transcription with the patient, who gave verbal consent to proceed.  Discussed the use of AI scribe software for clinical note transcription with the patient, who gave verbal consent to proceed.  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 The patient first noticed the pain while sleeping and describes it as a severe discomfort in the chest. This was the first occurrence of such pain, and it has not recurred since the initial episode.  The patient leads a sedentary lifestyle due to her job and the cold weather, which has limited her usual thrice-weekly walks to an hour each. The patient's last walk was in November, before the onset of cold weather. The patient also reports eating late at night before going to sleep on the day of the chest pain episode.  The patient has been managing her diabetes with Ozempic, which she has been taking for about a year, and metformin. She also takes atorvastatin for her high cholesterol and Pepcid.      Labs    Lab Results  Component Value Date   NA 138 10/20/2023   K 3.6 10/20/2023   CO2 24 10/20/2023   GLUCOSE 118 (H) 10/20/2023   BUN 10 10/20/2023   CREATININE 0.52 10/20/2023   CALCIUM 9.2 10/20/2023   GFRNONAA >60 10/20/2023      Latest Ref Rng & Units 10/20/2023    2:26 PM 11/27/2012    4:28 PM  BMP   Glucose 70 - 99 mg/dL 191  96   BUN 6 - 20 mg/dL 10  8   Creatinine 4.78 - 1.00 mg/dL 2.95  6.21   Sodium 308 - 145 mmol/L 138  135   Potassium 3.5 - 5.1 mmol/L 3.6  3.7   Chloride 98 - 111 mmol/L 104  101   CO2 22 - 32 mmol/L 24  23   Calcium 8.9 - 10.3 mg/dL 9.2  9.1       Latest Ref Rng & Units 10/20/2023    2:26 PM 09/06/2013    7:17 AM 12/01/2012    5:10 AM  CBC  WBC 4.0 - 10.5 K/uL 3.7   5.7   Hemoglobin 12.0 - 15.0 g/dL 65.7  84.6  96.2   Hematocrit 36.0 - 46.0 % 38.5   34.5   Platelets 150 - 400 K/uL 340   181    External Labs:  Care everywhere labs 06/27/2023:  A1c 7.0%.  TSH normal at 0.912.  Total cholesterol 252, triglycerides 115, HDL 98, LDL 135.  Serum glucose 118 mg, BUN 15, creatinine 0.58, potassium 4.0, LFTs normal.  Review of Systems  Cardiovascular:  Negative for chest pain, dyspnea on exertion and leg swelling.   Physical Exam:   VS:  BP 122/68 (BP Location: Left Arm, Patient Position: Sitting, Cuff Size: Normal)   Pulse 72   Resp 16   Wt  168 lb (76.2 kg)   LMP 12/16/2021   SpO2 98%   BMI 28.84 kg/m    Wt Readings from Last 3 Encounters:  12/23/23 168 lb (76.2 kg)  10/20/23 167 lb (75.8 kg)  12/28/15 174 lb (78.9 kg)    Physical Exam Neck:     Vascular: No carotid bruit or JVD.  Cardiovascular:     Rate and Rhythm: Normal rate and regular rhythm.     Pulses: Intact distal pulses.     Heart sounds: Normal heart sounds. No murmur heard.    No gallop.  Pulmonary:     Effort: Pulmonary effort is normal.     Breath sounds: Normal breath sounds.  Abdominal:     General: Bowel sounds are normal.     Palpations: Abdomen is soft.  Musculoskeletal:     Right lower leg: No edema.     Left lower leg: No edema.    Studies Reviewed: Marland Kitchen    Chest x-ray two-view 10/20/2023: The heart size and mediastinal contours are within normal limits. Both lungs are clear. The visualized skeletal structures are unremarkable.  EKG:    EKG  Interpretation Date/Time:  Tuesday December 23 2023 14:49:33 EDT Ventricular Rate:  74 PR Interval:  150 QRS Duration:  84 QT Interval:  384 QTC Calculation: 426 R Axis:   59  Text Interpretation: EKG 12/23/2023: Normal sinus rhythm at rate of 74 bpm, incomplete right bundle branch block.  Normal EKG.  Comparison to 10/20/2023 EKG was not performed due to lead misplacement. Confirmed by Delrae Rend [phone]) on 12/23/2023 3:07:05 PM    Medications and allergies    Allergies  Allergen Reactions   Omnicef [Cefdinir] Swelling    Current Outpatient Medications:    atorvastatin (LIPITOR) 20 MG tablet, Take 20 mg by mouth daily., Disp: , Rfl:    ezetimibe (ZETIA) 10 MG tablet, Take 1 tablet (10 mg total) by mouth daily., Disp: 90 tablet, Rfl: 0   famotidine (PEPCID) 20 MG tablet, Take 20 mg by mouth daily., Disp: , Rfl:    metFORMIN (GLUCOPHAGE-XR) 500 MG 24 hr tablet, Take 500 mg by mouth 2 (two) times daily., Disp: , Rfl:    Semaglutide,0.25 or 0.5MG /DOS, (OZEMPIC, 0.25 OR 0.5 MG/DOSE,) 2 MG/1.5ML SOPN, Inject 0.5 mg into the skin once a week., Disp: , Rfl:    ASSESSMENT AND PLAN: .      ICD-10-CM   1. Precordial pain  R07.2 EKG 12-Lead    MYOCARDIAL PERFUSION IMAGING    Cardiac Stress Test: Informed Consent Details: Physician/Practitioner Attestation; Transcribe to consent form and obtain patient signature    2. Type 2 diabetes mellitus without complication, without long-term current use of insulin (HCC)  E11.9 Cardiac Stress Test: Informed Consent Details: Physician/Practitioner Attestation; Transcribe to consent form and obtain patient signature    3. Mixed hyperlipidemia  E78.2 ezetimibe (ZETIA) 10 MG tablet    Cardiac Stress Test: Informed Consent Details: Physician/Practitioner Attestation; Transcribe to consent form and obtain patient signature     Meds ordered this encounter  Medications   ezetimibe (ZETIA) 10 MG tablet    Sig: Take 1 tablet (10 mg total) by mouth daily.     Dispense:  90 tablet    Refill:  0    REFILLS TO Charna Archer, NP    Medications Discontinued During This Encounter  Medication Reason   famotidine (PEPCID) 20 MG tablet Patient Preference   pantoprazole (PROTONIX) 20 MG tablet Patient Preference     Assessment  and Plan    Chest Pain   Intermittent chest pain occurred once, prompting an emergency room visit. Differential diagnosis includes cardiac issues and gastroesophageal reflux disease. No pain recurrence since the initial episode. Risk factors include diabetes and uncontrolled mixed hyperlipidemia. Schedule a nuclear stress test to evaluate cardiac function. Advise resuming walking to monitor for any recurrence of chest pain.  Hyperlipidemia   Cholesterol levels are not well controlled with atorvastatin 20 mg. LDL is 135 mg/dL, above the target of 70 mg/dL. She is at high risk for cardiovascular events due to diabetes and hyperlipidemia. Prescribe ezetimibe 10 mg daily with atorvastatin. Discuss potential need to increase atorvastatin dose to 40 mg or higher. Advise reducing red meat intake and losing weight. Recommend follow-up with primary care physician to monitor cholesterol levels.  Type 2 Diabetes Mellitus   Diabetes management includes metformin and semaglutide. Current semaglutide dose is 0.5 mg, which may need escalation to improve weight loss and glycemic control. She is at high risk for cardiovascular events due to diabetes. Recommend increasing semaglutide dose to the maximum tolerated dose to aid in weight loss. Consider switching from metformin to empagliflozin for additional cardiovascular protection. Encourage slow eating to prevent nausea associated with semaglutide.  Follow-up   Follow-up plans discussed to ensure proper management of conditions and response to treatment. Stress test results will guide further management. Schedule follow-up appointment with primary care physician after starting ezetimibe. Perform blood  work to monitor cholesterol levels after medication adjustments. Contact if stress test results are abnormal to discuss further management.           Signed,  Yates Decamp, MD, [facility] 12/23/2023, 7:50 PM [facility] [address] [place] [address] [place], [place] [address] Phone: [phone]. Fax:  [phone]

## 2023-12-31 ENCOUNTER — Ambulatory Visit (HOSPITAL_COMMUNITY)

## 2024-01-06 ENCOUNTER — Telehealth: Payer: Self-pay

## 2024-01-06 NOTE — Telephone Encounter (Signed)
 Detailed instructions left on the patient's answering machine. Asked to call back with any questions.  S.Alessa Mazur CCT

## 2024-01-13 ENCOUNTER — Telehealth: Payer: Self-pay | Admitting: Cardiology

## 2024-01-13 ENCOUNTER — Encounter: Payer: Self-pay | Admitting: Cardiology

## 2024-01-13 ENCOUNTER — Ambulatory Visit (HOSPITAL_COMMUNITY): Attending: Cardiovascular Disease

## 2024-01-13 DIAGNOSIS — R072 Precordial pain: Secondary | ICD-10-CM | POA: Insufficient documentation

## 2024-01-13 LAB — MYOCARDIAL PERFUSION IMAGING
Angina Index: 0
Duke Treadmill Score: -4
Estimated workload: 7
Exercise duration (min): 6 min
Exercise duration (sec): 1 s
LV dias vol: 49 mL (ref 46–106)
LV sys vol: 17 mL
MPHR: 170 {beats}/min
Nuc Stress EF: 66 %
Peak HR: 155 {beats}/min
Percent HR: 91 %
Rest HR: 66 {beats}/min
Rest Nuclear Isotope Dose: 9.8 mCi
SDS: 1
SRS: 0
SSS: 1
ST Depression (mm): 2 mm
Stress Nuclear Isotope Dose: 31.4 mCi
TID: 1.02

## 2024-01-13 MED ORDER — TECHNETIUM TC 99M TETROFOSMIN IV KIT
9.8000 | PACK | Freq: Once | INTRAVENOUS | Status: AC | PRN
  Administered 2024-01-13: 9.8 via INTRAVENOUS

## 2024-01-13 MED ORDER — TECHNETIUM TC 99M TETROFOSMIN IV KIT
31.4000 | PACK | Freq: Once | INTRAVENOUS | Status: AC | PRN
Start: 2024-01-13 — End: 2024-01-13
  Administered 2024-01-13: 31.4 via INTRAVENOUS

## 2024-01-13 NOTE — Progress Notes (Signed)
 Needs OV to discuss possible cardiac cath as she is high risk

## 2024-01-13 NOTE — Telephone Encounter (Signed)
-----   Message from Knox Perl sent at 01/13/2024 12:33 PM EDT ----- Needs OV to discuss possible cardiac cath as she is high risk

## 2024-01-13 NOTE — Telephone Encounter (Signed)
 I spoke with patient and scheduled her to see Dr Berry Bristol on April 21

## 2024-01-13 NOTE — Telephone Encounter (Signed)
 Pt was returning nurse call and is requesting a callback regarding results. Please advise

## 2024-01-19 ENCOUNTER — Ambulatory Visit: Attending: Cardiology | Admitting: Cardiology

## 2024-01-19 ENCOUNTER — Encounter: Payer: Self-pay | Admitting: Cardiology

## 2024-01-19 VITALS — BP 110/78 | HR 83 | Ht 64.0 in | Wt 170.6 lb

## 2024-01-19 DIAGNOSIS — R072 Precordial pain: Secondary | ICD-10-CM | POA: Diagnosis not present

## 2024-01-19 DIAGNOSIS — E119 Type 2 diabetes mellitus without complications: Secondary | ICD-10-CM | POA: Diagnosis not present

## 2024-01-19 DIAGNOSIS — E782 Mixed hyperlipidemia: Secondary | ICD-10-CM | POA: Diagnosis not present

## 2024-01-19 NOTE — Progress Notes (Signed)
 Cardiology Office Note:  .   Date:  01/19/2024  ID:  Patty Blair, DOB 02-Apr-1971, MRN [id number] PCP: Artie Laster, FNP  [facility] Providers Cardiologist:  Knox Perl, MD   History of Present Illness: .    Discussed the use of AI scribe software for clinical note transcription with the patient, who gave verbal consent to proceed.  History of Present Illness The patient, with a history of hypercholesterolemia and diabetes, presents for a follow-up visit for chest pain.  She has been adhering to her medication regimen, which also includes Lipitor for cholesterol management and metformin and semaglutide (Ozempic) for diabetes management. She denies any chest pain and reports feeling well overall. She has not noticed any significant weight loss with semaglutide, but she is trying to eat slower to aid in weight management.   Patty Blair is a 53 y.o. Female patient with longstanding diabetes mellitus, hypercholesterolemia seen by me on 12/23/2023 for precordial pain, in view of diabetes mellitus, chest pain being precordial, hypercholesterolemia, underwent nuclear stress test which revealed borderline EKG changes of 2 mm ST depression in the inferolateral leads which normalized immediately into recovery and had a normal perfusion with normal LVEF.  She now presents for follow-up.  I had also started her on Zetia  in view of LDL not being at goal in view of diabetes mellitus.  Patient is presently asymptomatic.  States that she is tolerating all her medications well and has resumed all her activities.  Labs   External Labs:  Care everywhere labs 06/27/2023:   A1c 7.0%.  TSH normal at 0.912.   Total cholesterol 252, triglycerides 115, HDL 98, LDL 135.   Serum glucose 118 mg, BUN 15, creatinine 0.58, potassium 4.0, LFTs normal.  Care Everywhere labs 12/25/2023: Total cholesterol 162, triglycerides 38, HDL 80, LDL 73.  Review of Systems  Cardiovascular:  Negative for  chest pain, dyspnea on exertion and leg swelling.   Physical Exam:   VS:  BP 110/78   Pulse 83   Ht 5\' 4"  (1.626 m)   Wt 170 lb 9.6 oz (77.4 kg)   LMP 12/16/2021   SpO2 98%   BMI 29.28 kg/m    Wt Readings from Last 3 Encounters:  01/19/24 170 lb 9.6 oz (77.4 kg)  01/13/24 168 lb (76.2 kg)  12/23/23 168 lb (76.2 kg)    Physical Exam Neck:     Vascular: No carotid bruit or JVD.  Cardiovascular:     Rate and Rhythm: Normal rate and regular rhythm.     Pulses: Intact distal pulses.     Heart sounds: Normal heart sounds. No murmur heard.    No gallop.  Pulmonary:     Effort: Pulmonary effort is normal.     Breath sounds: Normal breath sounds.  Abdominal:     General: Bowel sounds are normal.     Palpations: Abdomen is soft.  Musculoskeletal:     Right lower leg: No edema.     Left lower leg: No edema.    Studies Reviewed: .    MYOCARDIAL PERFUSION IMAGING 01/13/2024   Narrative   Findings are equivocal. The study is intermediate risk.   2.0 mm of horizontal ST depression in the inferior and lateral leads (I, II, III, aVL, aVF, V4, V5 and V6) was noted.   LV perfusion is normal. There is no evidence of ischemia. There is no evidence of infarction.   Left ventricular function is normal. Nuclear stress EF: 66%. The  left ventricular ejection fraction is hyperdynamic (>65%). End systolic cavity size is normal.   Prior study not available for comparison.   Normal perfusion and EF No TID. However ECG was markedly positive with 2 mm horizontal ST segment depression in inferior lateral leads starting in stage 2 at HR of 143 bpm and quickly resolved in recovery EKG:    Medications and allergies    Allergies  Allergen Reactions   Omnicef [Cefdinir] Swelling     Current Outpatient Medications:    atorvastatin (LIPITOR) 20 MG tablet, Take 20 mg by mouth daily., Disp: , Rfl:    azelastine (ASTELIN) 0.1 % nasal spray, Place into both nostrils 2 (two) times daily., Disp: , Rfl:     carbamide peroxide (DEBROX) 6.5 % OTIC solution, 5 drops., Disp: , Rfl:    ezetimibe  (ZETIA ) 10 MG tablet, Take 1 tablet (10 mg total) by mouth daily., Disp: 90 tablet, Rfl: 0   metFORMIN (GLUCOPHAGE-XR) 500 MG 24 hr tablet, Take 500 mg by mouth 2 (two) times daily., Disp: , Rfl:    Semaglutide,0.25 or 0.5MG /DOS, (OZEMPIC, 0.25 OR 0.5 MG/DOSE,) 2 MG/1.5ML SOPN, Inject 0.5 mg into the skin once a week., Disp: , Rfl:    famotidine  (PEPCID ) 20 MG tablet, Take 20 mg by mouth daily. (Patient not taking: Reported on 01/19/2024), Disp: , Rfl:    No orders of the defined types were placed in this encounter.    There are no discontinued medications.   ASSESSMENT AND PLAN: .      ICD-10-CM   1. Precordial pain  R07.2     2. Mixed hyperlipidemia  E78.2     3. Type 2 diabetes mellitus without complication, without long-term current use of insulin (HCC)  E11.9       Assessment and Plan Assessment & Plan Chest pain   Chest pain has resolved. Initial EKG abnormalities normalized immediately after stopping the stress test. Nuclear perfusion and heart function tests were normal. No current chest pain reported.  Overall low risk stress test hence continued medical therapy is advised.  Unless she has recurrence of chest pain, I will see her back on a as needed basis.  Hyperlipidemia   Hyperlipidemia is well controlled with a significant reduction in LDL cholesterol from 135 mg/dL to 73 mg/dL after starting Zetia , indicating a 40% reduction. Continue Lipitor 20 mg daily and Zetia  10 mg daily to maintain cholesterol levels below target. Zetia  has shown no side effects and is beneficial in protecting against cardiovascular and cerebrovascular events, especially in the context of diabetes.  Type 2 diabetes mellitus   Type 2 diabetes is well controlled with semaglutide (Ozempic) and metformin. Discussed dietary habits to aid in weight loss, emphasizing the importance of eating slowly and taking breaks  during meals. Continue semaglutide (Ozempic) and metformin. Advise her to eat slowly and take breaks during meals to aid in weight loss.  Signed,  Knox Perl, MD, [facility] 01/19/2024, 3:25 PM [facility] [address] [place] [address] [place], [place] [address] Phone: [phone]. Fax:  [phone]

## 2024-01-19 NOTE — Patient Instructions (Signed)
 Medication Instructions:  Your physician recommends that you continue on your current medications as directed. Please refer to the Current Medication list given to you today.  *If you need a refill on your cardiac medications before your next appointment, please call your pharmacy*  Lab Work: none If you have labs (blood work) drawn today and your tests are completely normal, you will receive your results only by: MyChart Message (if you have MyChart) OR A paper copy in the mail If you have any lab test that is abnormal or we need to change your treatment, we will call you to review the results.  Testing/Procedures: none  Follow-Up: At [facility], you and your health needs are our priority.  As part of our continuing mission to provide you with exceptional heart care, our providers are all part of one team.  This team includes your primary Cardiologist (physician) and Advanced Practice Providers or APPs (Physician Assistants and Nurse Practitioners) who all work together to provide you with the care you need, when you need it.  Your next appointment:   As needed  Provider:   Yates Decamp, MD     We recommend signing up for the patient portal called "MyChart".  Sign up information is provided on this After Visit Summary.  MyChart is used to connect with patients for Virtual Visits (Telemedicine).  Patients are able to view lab/test results, encounter notes, upcoming appointments, etc.  Non-urgent messages can be sent to your provider as well.   To learn more about what you can do with MyChart, go to [email or web address].   Other Instructions       1st Floor: - Lobby - Registration  - Pharmacy  - Lab - Cafe  2nd Floor: - PV Lab - Diagnostic Testing (echo, CT, nuclear med)  3rd Floor: - Vacant  4th Floor: - TCTS (cardiothoracic surgery) - AFib Clinic - Structural Heart Clinic - Vascular Surgery  - Vascular Ultrasound  5th Floor: - HeartCare Cardiology  (general and EP) - Clinical Pharmacy for coumadin, hypertension, lipid, weight-loss medications, and med management appointments    Valet parking services will be available as well.
